# Patient Record
Sex: Male | Born: 1975 | Race: White | Hispanic: No | Marital: Married | Smoking: Never smoker
Health system: Southern US, Community
[De-identification: ages and names within clinical notes are randomized; demographics above are authoritative.]

## PROBLEM LIST (undated history)

## (undated) DIAGNOSIS — G43909 Migraine, unspecified, not intractable, without status migrainosus: Secondary | ICD-10-CM

## (undated) DIAGNOSIS — Z9889 Other specified postprocedural states: Secondary | ICD-10-CM

## (undated) DIAGNOSIS — J45909 Unspecified asthma, uncomplicated: Secondary | ICD-10-CM

## (undated) DIAGNOSIS — T7840XA Allergy, unspecified, initial encounter: Secondary | ICD-10-CM

## (undated) DIAGNOSIS — K219 Gastro-esophageal reflux disease without esophagitis: Secondary | ICD-10-CM

## (undated) DIAGNOSIS — M6282 Rhabdomyolysis: Secondary | ICD-10-CM

## (undated) DIAGNOSIS — L509 Urticaria, unspecified: Secondary | ICD-10-CM

## (undated) HISTORY — PX: SHOULDER SURGERY: SHX246

## (undated) HISTORY — DX: Migraine, unspecified, not intractable, without status migrainosus: G43.909

## (undated) HISTORY — DX: Allergy, unspecified, initial encounter: T78.40XA

## (undated) HISTORY — DX: Urticaria, unspecified: L50.9

## (undated) HISTORY — DX: Unspecified asthma, uncomplicated: J45.909

## (undated) HISTORY — DX: Gastro-esophageal reflux disease without esophagitis: K21.9

## (undated) HISTORY — DX: Rhabdomyolysis: M62.82

---
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
## 2003-01-27 ENCOUNTER — Encounter (FREE_STANDING_LABORATORY_FACILITY): Payer: Self-pay | Admitting: Pathology

## 2004-07-14 ENCOUNTER — Encounter (FREE_STANDING_LABORATORY_FACILITY): Payer: Self-pay | Admitting: Pathology

## 2005-07-04 ENCOUNTER — Ambulatory Visit (INDEPENDENT_AMBULATORY_CARE_PROVIDER_SITE_OTHER): Payer: Self-pay | Admitting: Cardiovascular Disease

## 2005-07-12 ENCOUNTER — Ambulatory Visit (INDEPENDENT_AMBULATORY_CARE_PROVIDER_SITE_OTHER): Payer: Self-pay | Admitting: Cardiovascular Disease

## 2007-03-29 ENCOUNTER — Ambulatory Visit (INDEPENDENT_AMBULATORY_CARE_PROVIDER_SITE_OTHER): Admitting: Specialist

## 2007-03-29 ENCOUNTER — Other Ambulatory Visit: Payer: Self-pay | Admitting: EXTERNAL

## 2007-04-22 ENCOUNTER — Ambulatory Visit (INDEPENDENT_AMBULATORY_CARE_PROVIDER_SITE_OTHER): Payer: BC Managed Care – PPO

## 2007-04-24 ENCOUNTER — Ambulatory Visit (INDEPENDENT_AMBULATORY_CARE_PROVIDER_SITE_OTHER): Payer: BC Managed Care – PPO | Admitting: ORTHOPEDIC, SPORTS MEDICINE

## 2007-05-08 ENCOUNTER — Ambulatory Visit (INDEPENDENT_AMBULATORY_CARE_PROVIDER_SITE_OTHER): Payer: BC Managed Care – PPO | Admitting: ORTHOPEDIC, SPORTS MEDICINE

## 2007-05-14 NOTE — Progress Notes (Signed)
[facility] Department of Orthopaedics  [address]  [place], [place] [address]      SPORTS MEDICINE PROGRESS NOTE    PATIENT NAME: Mike Allen, Mike Allen  CHART NUMBER: [id number]  DATE OF BIRTH: 1975-07-14  DATE OF SERVICE: 05/08/2007    SUBJECTIVE: Keilan presents for followup of his gastrocnemius and Achilles partial tear. He reports improvement of his symptoms since I have previously seen him. He also notes a development of a bruise really across three separate areas; one over the lateral calf in the area in which he initially felt a tearing sensation and also one over the posterior calf area and the Achilles and another over the medial aspect of the heel. These have begun to improve. His brace has been in a position of 10 degrees of plantar flexion as he is unable to get his foot into neutral position. He states he is now able to get his foot in to neutral position and would like to adjust the brace accordingly. He is really not using the Percocet at this point. He is still using Advil occasionally.    OBJECTIVE: There is mild tenderness over the lateral gastrocnemius. There is really no tenderness over the medial gastrocnemius. There is mild diffuse tenderness over the Achilles tendon. He has significant weakness with any active plantar flexion. Thompson's test is negative. He has discomfort with passive dorsiflexion. He is able to get the foot to just beyond neutral. Pulses are palpable, and capillary refill is brisk.    ASSESSMENT: Left lateral gastrocnemius strain and Achilles partial thickness tear.    PLAN: He will continue in the boot. We have adjusted it to a neutral position. He will continue with the Advil as needed, and I will see him back in two weeks.      Beola Cord, MD  Assistant Professor  [place] Department of Orthopaedics    [id number]; D: 05/08/2007 17:09:19; T: 05/10/2007 14:00:01

## 2007-05-22 ENCOUNTER — Encounter (INDEPENDENT_AMBULATORY_CARE_PROVIDER_SITE_OTHER): Payer: BC Managed Care – PPO | Admitting: ORTHOPEDIC, SPORTS MEDICINE

## 2007-05-29 NOTE — Progress Notes (Signed)
 [facility] Department of Orthopaedics  [address]  [place], [place] [address]      SPORTS MEDICINE PROGRESS NOTE    PATIENT NAME: Mike Allen, Mike Allen  CHART NUMBER: [id number]  DATE OF BIRTH: 12-17-1975  DATE OF SERVICE: 05/22/2007    SUBJECTIVE: Mike Allen presents for followup of his right Achilles partial tear. He has been in the Cam boot since his last appointment. He reports coming out only with brief episodes at home. He is able to improve his motion across his ankle. He also notes a decreasing amount of swelling throughout his calf.    OBJECTIVE: On examination, the medial and lateral gastrocnemius are nontender. There is no swelling within the gastrocnemius present at this time. There continues to be some fullness in induration across the Achilles tendon. He is moderately tender across the proximal aspect of the Achilles. He is minimally tender over the distal Achilles tendon. He continues to be significantly tender, approximately 2 to 4 cm above the distal insertion. He maintains active motion with dorsiflexion, plantarflexion, inversion and eversion. He has improved strength with resisted plantar flexion upon first standing. He has minimal discomfort. He is able to do a heel lift with both feet; however, he puts most of his pressure on the opposite side.    ASSESSMENT: Partial thickness Achilles tear and lateral gastrocnemius strain.    PLAN: He will continue with the Cam walker with any prolonged walking. However, he can begin coming out of the Cam walker for short durations. Additionally, I have requested that he begin physical therapy working with modalities, stretching, and progressive strengthening. I will see him back in two weeks.      Morene JONETTA Houseman, MD  Assistant Professor  [place] Department of Orthopaedics    [id number]; D: 05/22/2007 18:13:02; T: 05/24/2007 06:39:34

## 2007-06-05 ENCOUNTER — Encounter (INDEPENDENT_AMBULATORY_CARE_PROVIDER_SITE_OTHER): Payer: BC Managed Care – PPO | Admitting: ORTHOPEDIC, SPORTS MEDICINE

## 2007-06-11 NOTE — Progress Notes (Signed)
[facility] Department of Orthopaedics  [address]  [place], [place] [address]      SPORTS MEDICINE PROGRESS NOTE    PATIENT NAME: Mike Allen, Mike Allen  CHART NUMBER: [id number]  DATE OF BIRTH: July 01, 1975  DATE OF SERVICE: 06/05/2007    SUBJECTIVE: Mike Allen presents for followup of his partial Achilles tear. He is doing significantly better. He is able to fully wean from the Cam walker. He has now been out of the Cam walker for about five days. He notes that he is limping more so in the morning as he feels that he has significant tightness in his calf when he first awakens.    OBJECTIVE: He is moderately tender to palpation over the distal Achilles tendon approximately 4 cm above the distal insertion. He is mildly tender to palpation across the lateral gastrocnemius and to a lesser degree over the medial gastrocnemius. He maintains good motion with dorsiflexion and plantar flexion. Strength is significantly improved with resisted plantar flexion, and he has minimal discomfort with this. He remains grossly neurovascularly intact across the lower extremity. Pulses are palpable.    ASSESSMENT: Partial thickness Achilles tear and lateral gastroc strain.    PLAN: He will continue with physical therapy. At this point, it is appropriate to begin a strengthening program. Additionally, he may benefit from using the Cam walker while sleeping at night to prevent further constriction and tightening of the Achilles overnight. I will see him back in three weeks to see how he has done with continued therapy and progressive strengthening.      Beola Cord, MD  Assistant Professor  [facility] Department of Orthopaedics    [id number]; D: 06/05/2007 16:54:24; T: 06/06/2007 23:37:09

## 2007-06-26 ENCOUNTER — Encounter (INDEPENDENT_AMBULATORY_CARE_PROVIDER_SITE_OTHER): Payer: BC Managed Care – PPO | Admitting: ORTHOPEDIC, SPORTS MEDICINE

## 2007-06-28 ENCOUNTER — Encounter (INDEPENDENT_AMBULATORY_CARE_PROVIDER_SITE_OTHER): Payer: BC Managed Care – PPO | Admitting: Specialist

## 2007-07-06 NOTE — Progress Notes (Signed)
[facility] Department of Orthopaedics  [address]  [place], [place] [address]      PROGRESS NOTE    PATIENT NAME: Mike Allen, Mike Allen  CHART NUMBER: [id number]  DATE OF BIRTH: August 15, 1975  DATE OF SERVICE: 06/26/2007    SUBJECTIVE: Mr. Ramakrishnan presents today for followup of his right gastrocnemius strain and partial tear of the Achilles tendon. He brings a note today from physical therapy. They report that he is making good progress, but does still have some significant strength deficit. They also question whether he would benefit from orthotics. He actually brings in 2 different pairs of orthotic inserts that he has. One pair is a custom made orthotic from a few years ago. This is a soft orthotic. It does have a medial heel post, but has a very flexible arch support. His second pair he purchased at a specialty store in [place]. This has a more firm plastic arch support; however, he does note that there is an area of the plastic, which is beginning to break down already and he has only had these for around 4 or 5 months.     OBJECTIVE: He has a mild limp. He is nontender over the medial gastroc. He is moderately tender over the medial border of the Achilles tendon primarily about 2-4 cm above the distal insertion site. He is nontender over the calcaneus. He is nontender over the ankle. He has adequate strength on manual muscle testing with dorsiflexion and plantarflexion, inversion and eversion, and has minimal discomfort with this. He does have pain isolated to the Achilles with passive stretching, but he remains neurovascularly intact.     ASSESSMENT: Right Achilles partial thickness tear and medial gastrocnemius strain.     PLAN: He will continue with progressive strengthening in physical therapy. Additionally, I have given him an order for custom orthotics, as I feel that a new pair of custom orthotics could combine the benefits of improved arch support, as well as the medial heel post. This should also be more durable than his current off the shelf orthotics that he is using. I will see him back in 4-5 weeks.      Beola Cord, MD  Assistant Professor  [place] Department of Orthopaedics    [id number]; D: 06/26/2007 12:47:58; T: 06/29/2007 16:32:03

## 2007-08-07 ENCOUNTER — Encounter (INDEPENDENT_AMBULATORY_CARE_PROVIDER_SITE_OTHER): Payer: BC Managed Care – PPO | Admitting: ORTHOPEDIC, SPORTS MEDICINE

## 2007-08-08 NOTE — Progress Notes (Signed)
 [facility] Department of Orthopaedics  [address]  [place], [place] [address]      SPORTS MEDICINE PROGRESS NOTE    PATIENT NAME: Mike, Allen  CHART NUMBER: [id number]  DATE OF BIRTH: 11/03/1975  DATE OF SERVICE: 08/07/2007    SUBJECTIVE: Mike Allen presents for followup of his right Achilles partial tear. He is doing very well. He states he was to bring a note from his therapist, but left this in his car but apparently per the therapist, they are recommending a transition to into a home program. He has been using the custom orthotics and feels very good on the right side and notes some discomfort in his arch on the left side. Incidentally, he notes that it just before injuring his right Achilles he was beginning to have some arch pain on the left side; however, this really has not been bothering him as he has been recovering from his injury. He is now up to a very early running program, running up to a half a mile at a 5-1/2 minute pace. He is eager to continue to progress his running activities as well as to return to basketball and some soccer activities.    OBJECTIVE: He is in no acute distress. He has a nonantalgic gait. Right Achilles: There is a fullness of the distal Achilles about 2-3 cm proximal to the distal insertion. He is minimally tender in this area. He has good flexibility of the calf muscles and has no pain with passive stretching. He has good strength on dorsiflexion, inversion and eversion. There is weakness on heel raise on the right side compared to the left and has minimal difficulty with balance on heel raise on the isolated right side. Left foot: There is minimal tenderness over the mid to distal plantar arch, which is noted more so with the foot in a passive dorsiflexed position. He really has no tenderness or discomfort with full plantarflexion. He is nontender over the Achilles tendon. He is grossly neurovascularly intact across both lower extremities.    ASSESSMENT:  1.  Right Achilles tendon strain.  2. Left plantar fasciitis.    PLAN: He will transition from physical therapy to a home exercise program. He will continue with pliometrics as well as a functional progression with return to activity. We will still avoid full sprinting until he is back to running at a steady pace for a greater period of time. I will see him back in one month to see how he has done with the functional progression. Additionally, recommend aggressive stretching and icing on the left side. If he is having continued problems on the left side with pain we can certainly modify his orthotics, although his orthotics appear to be holding him in a proper position at the present.      Morene JONETTA Houseman, MD  Assistant Professor  [facility] Department of Orthopaedics    [id number]; D: 08/07/2007 09:46:01; T: 08/08/2007 11:28:01

## 2007-09-04 ENCOUNTER — Encounter (INDEPENDENT_AMBULATORY_CARE_PROVIDER_SITE_OTHER): Payer: BC Managed Care – PPO | Admitting: ORTHOPEDIC, SPORTS MEDICINE

## 2009-10-26 ENCOUNTER — Ambulatory Visit (HOSPITAL_BASED_OUTPATIENT_CLINIC_OR_DEPARTMENT_OTHER): Payer: BC Managed Care – PPO

## 2009-10-26 ENCOUNTER — Ambulatory Visit (INDEPENDENT_AMBULATORY_CARE_PROVIDER_SITE_OTHER): Payer: BC Managed Care – PPO

## 2009-10-26 ENCOUNTER — Encounter (INDEPENDENT_AMBULATORY_CARE_PROVIDER_SITE_OTHER): Payer: Self-pay

## 2009-10-26 VITALS — BP 121/80 | HR 78 | Temp 98.2°F | Resp 18 | Wt 144.2 lb

## 2009-10-26 DIAGNOSIS — R109 Unspecified abdominal pain: Secondary | ICD-10-CM

## 2009-10-26 DIAGNOSIS — K5900 Constipation, unspecified: Secondary | ICD-10-CM

## 2009-10-26 NOTE — Progress Notes (Signed)
History of Present Illness: Mike Allen is a 34 y.o. male who presents to the Urgent Care today with chief complaint of Abdominal Cramping for 1 week(s), fluctuates.  Bloating at times, gas at times, cramping.   Occasional diarrhea described as soft stools.  Also, patient has associated occasional acid reflux and nausea. States ate at [employer] last Tuesday and states wings did not taste right.  Has not tried antiacids.  No history of acid reflux, GERD.  No vomiting, fevers.  Decreased appetite, forcing self to eat and drink.  No recent ABX.  Was in [place] end of [place] for work but no issues there.  Symptoms began after eating out last Tuesday locally.  No dysuria. No back pain.    Location: GI  Quality: cramping and bloating  Onset: 6 days ago  Severity: 4 out of 10  Timing: intermittent  Context: none  Modifying factors: fluids, yogurt  Associated symptoms: as above    I reviewed and confirmed the patient's past medical history taken by the nurse or medical assistant with the addition of the following:    Past Medical History:    Past Medical History   Diagnosis Date   . Migraine        Past Surgical History:    History reviewed.  No pertinent past surgical history.  Allergies:  No Known Allergies  Medications:    Current outpatient prescriptions   Medication Sig   . Ibuprofen (MOTRIN) 200 mg Tab take 200 mg by mouth Four times a day as needed.   Marland Kitchen MAGNESIUM ORAL take  by mouth.   Marland Kitchen ZINC ORAL take  by mouth.   Marland Kitchen amitriptyline (ELAVIL) 10 mg Tab 1qhs x 7d, then 2qhs x 7d, then 3qhs       Social History:    History   Substance Use Topics   . Smoking status: Never Smoker    . Smokeless tobacco: Not on file   . Alcohol Use: Yes      social       Family History: No significant family history.  No family history on file.  Review of Systems:  General: no fever and fatigue  Gastrointestinal: nausea, no vomiting, diarrhea, abdominal pain and bloating and cramping  Genitourinary: no dysuria and no flank pain   All other review of systems were negative    Physical Exam:  Vital signs:   Filed Vitals:    10/26/09 1337   BP: 121/80   Pulse: 78   Temp: 36.8 C (98.2 F)   TempSrc: Tympanic   Resp: 18   Weight: 65.4 kg (144 lb 2.9 oz)   SpO2: 98%       General: Well appearing and No acute distress  Pulmonary: clear to auscultation bilaterally, no wheezes, no rales and no rhonchi  Cardiovascular: regular rate/rhythm and normal S1/S2  Gastrointestinal: Non-distended, normal bowel sounds, no rebound, no guarding and TTP left lower quad and mid-epigastric region  Psychiatric: appropriate affect and behavior  Neurologic: alert and oriented x 3    Data Reviewed  Radiography:  KUB- moderate stool retention  Urine Dip Results:   Time collected: 1430  Glucose: Negative  Bilirubin: Negative  Ketones: Negative  Specific Gravity: 1.020  Blood: Negative  pH: 6.0  Protein: Negative  Urobilinogen: Normal   Nitrite: Negative  Leukocytes: Negative           Course: Condition at discharge: Stable    Differential Diagnosis: diverticulosis, pancreatitis, cholecystitis, appendicitis  Assessment: 1. Constipation (564.00A)    2. Abdominal bloating (787.3X)    3. Abdominal cramping (789.00BM)        Plan:  Orders Placed This Encounter   . XR KUB   . Urine RACK without Microscopy        Recommend OTC miralax.   Plan was discussed and patient verbalized understanding.  If symptoms are worsening or not improving the patient should return to the Urgent Care for further evaluation. Present to ED with any concerning or emergent symptoms for further workup and emergent care.    Supervising physician was physically present in Urgent Care and available for consultation and did not participate in the care of this patient.   Barrett Henle, PA-C 10/26/2009, 2:43 PM

## 2009-10-26 NOTE — Patient Instructions (Addendum)
Orders Placed This Encounter   . XR KUB   . Urine RACK without Microscopy    Recommend OTC Miralax as needed.  ________________________________________________________________________  Short Term Disability and Family Medical Leave Act  [facility] does NOT provide assistance with any disability applications.  If you feel your medical condition requires you to be on disability, you will need to follow up with  Your primary care physician or a specialist.  We apologize for any inconvenience.    For Medication Prescribed by [facility]:  As an Urgent Care facility, our clinic does NOT offer prescription refills over the telephone.    If you need more of the medication one of our medical providers prescribed, you will  Either need to be re-evaluated by [place] or see your primary care physician.    ________________________________________________________________________      It is very important that we have a phone number that is the single best way to contact you in the event that we become aware of important clinical information or concerns after your discharge.  If the phone number you provided at registration is NOT this number you should inform staff and registration prior to leaving.      Your treatment and evaluation today was focused on identifying and treating potentially emergent conditions based on your presenting signs, symptoms, and history.  The resulting initial clinical impression and treatment plan is not intended to be definitive or a substitute for a full physical examination and evaluation by your primary care provider.  If your symptoms persist, worsen, or you develop any new or concerning symptoms, you need to be evaluated.       If you received x-rays during your visit, be aware that the final and formal interpretation of those films by a radiologist may occur after your discharge.  If there is a significant discrepancy identified after your discharge, we will contact you at th telephone number provided at registration.      If you received a pelvic exam, you may have cultures pending for sexually transmitted diseases.  Positive cultures are reported to the [place] [facility] Department of Health as required by state law.  You should be contacted if you cultures are positive.  We will not contact you if they are negative.  You may contact the Health Information Management Office of [facility] to get a copy of your results.  You did NOT receive a PAP smear (the screening test for cervical).  This specific test for women is best performed by your gynecologist or primary care provider when indicated.      If you are over 44 year old, we cannot discuss your personal health information with a parent, spouse, family member, or anyone else without your express consent.  This does not include those who have legitimate access to your records and information to assist in your care under the provisions of HIPAA [facility] Portability and Accountability Act) law, or those to whom you have previously given express written consent to do so, such a legal guardian or Power of [place].      You may have received medication that may cause you to feel drowsy and/or light headed for several hours.  You may even experience some amnesia of your stay.  You should avoid operating a motor vehicle or performing any activity requiring complete alertness or coordination until you feel fully awake (approximately 24-48 hours).  Avoid alcoholic beverages.  You may also have a dry mouth for several  hours.  This is a normal side effect and will disappear as the effects of the medication wear off.       Instructions discussed with patient upon discharge by clinical staff with all questions answered.  Please call [facility] [phone]) if any further questions.  Go immediately to the emergency department if any concern or worsening symptoms.        Barrett Henle, PA-C 10/26/2009, 2:40 PM    Constipation in Adults     You are having problems with constipation. Constipation is having fewer than two bowel movements per week. Usually the stools are hard. As we grow older, constipation is more common. If you try to fix this with laxatives, the problem may often get worse. Laxatives taken over a long period of time make the colon muscles weaker. This will gradually make the constipation worse. A diet low in fiber, not taking in enough fluids, and taking some medications may all make these problems worse.     SOME MEDICATIONS WHICH MAY CAUSE CONSTIPATION ARE:   Diuretics (water pills)   Anticholinergics      Calcium channel blockers (a medication used for controlling blood pressure and used for the heart)   Anti-inflammatory agents (ibuprofen such as Advil or Motrin)     Narcotics (certain pain medications)    Antacids which contain aluminum       SOME DISEASES WHICH CONTRIBUTE TO CONSTIPATION ARE:    Diabetes    Strokes     Parkinson's disease   Depression     Dementia (the mind is not working properly and the thinking which makes [place] who we are is gone)   Other illnesses causing difficulties with salt and water metabolism       HOME CARE INSTRUCTIONS   Constipation is usually best cared for without medications. It can be helped by increasing dietary fiber and eating more fruits and vegetables. Metamucil may be added to your diet; take a tablespoon in an eight ounce glass of water or juice four times per day. Increasing your activities also helps improve regularity.    Ducolax or other suppositories as suggested by your caregiver will also help stimulate the colon to empty. If you are using antacids, such as aluminum or calcium containing products, which cause constipation, it will be helpful to switch to products containing magnesium if your caregiver has no objections.     If you have been given an enema today, this is only a temporary measure and should not be relied on for treatment of chronic (longstanding) constipation. If enemas are used long term, they will weaken the muscles in your colon and make constipation worse.     Stronger measures such as magnesium sulfate should be avoided if possible as these may lead to uncontrollable diarrhea. These stronger measures are tempting to use, however, if you are elderly. Using something like this will often not allow you time to even make it to the bathroom.     SEEK IMMEDIATE MEDICAL ATTENTION IF:   There is bright red blood in the stool.   The constipation continues for more than four days.   There is abdominal or rectal pain along with the constipation.   You have any questions or concerns or do not seem to be getting better.     Document Released: 04/04/2005  Document Re-Released: 09/26/2005  [facility] Patient Information 2008 [place], [place].

## 2012-12-12 ENCOUNTER — Emergency Department (EMERGENCY_DEPARTMENT_HOSPITAL): Payer: BC Managed Care – PPO

## 2012-12-12 ENCOUNTER — Emergency Department (HOSPITAL_COMMUNITY): Payer: BC Managed Care – PPO

## 2012-12-12 ENCOUNTER — Emergency Department
Admission: EM | Admit: 2012-12-12 | Discharge: 2012-12-12 | Disposition: A | Discharge status: Home / Self Care | Payer: BC Managed Care – PPO | Attending: Emergency Medicine | Admitting: Emergency Medicine

## 2012-12-12 MED ORDER — FENTANYL (PF) 50 MCG/ML INJECTION SOLUTION
INTRAMUSCULAR | Status: DC
Start: 2012-12-12 — End: 2012-12-13
  Filled 2012-12-12: qty 2

## 2012-12-12 MED ORDER — FENTANYL (PF) 50 MCG/ML INJECTION SOLUTION
100.00 ug | INTRAMUSCULAR | Status: AC
Start: 2012-12-12 — End: 2012-12-12
  Administered 2012-12-12: 100 ug via INTRAVENOUS

## 2012-12-12 MED ORDER — HYDROCODONE 5 MG-ACETAMINOPHEN 325 MG TABLET
1.00 | ORAL_TABLET | ORAL | Status: DC | PRN
Start: 2012-12-12 — End: 2014-08-12

## 2012-12-12 MED ORDER — PROPOFOL 10 MG/ML IV BOLUS
1.00 mg/kg | INJECTION | INTRAVENOUS | Status: AC
Start: 2012-12-12 — End: 2012-12-12
  Administered 2012-12-12: 100 mg via INTRAVENOUS
  Filled 2012-12-12: qty 20

## 2012-12-12 MED ORDER — FENTANYL (PF) 50 MCG/ML INJECTION SOLUTION
50.00 ug | INTRAMUSCULAR | Status: AC
Start: 2012-12-12 — End: 2012-12-12
  Administered 2012-12-12: 50 ug via INTRAVENOUS

## 2012-12-12 MED ORDER — FENTANYL (PF) 50 MCG/ML INJECTION SOLUTION
100.00 ug | INTRAMUSCULAR | Status: AC
Start: 2012-12-12 — End: 2012-12-12
  Administered 2012-12-12: 100 ug via INTRAVENOUS
  Filled 2012-12-12 (×2): qty 2

## 2012-12-12 MED ADMIN — fentaNYL (PF) 50 mcg/mL injection solution: 50 ug | INTRAVENOUS | NDC 00409909422

## 2012-12-12 NOTE — ED Resident Handoff Note (Signed)
No Known Allergies    Filed Vitals:    12/12/12 1843 12/12/12 2042   BP: 158/94 135/68   Pulse: 88 85   Temp: 36.7 C (98.1 F) 36.5 C (97.7 F)   Resp: 20 18   SpO2: 99% 97%       HPI:  Mike Allen is a 37 y.o. male presents with R shoudler dislocation.No trauma.    Pertinent Exam Findings:  Ant dislocation RUE  Intact nv    Pertinent Imaging/Lab results:  Labs Reviewed - No data to display         Pending Studies:  Xray post reduction    Consults:  none    Plan:  Fu post reduction xray  Dc with pain medicine    After a thorough discussion of the patient including presentation, ED course, and review of above information I have assumed care of Mike Allen from Dr. Kyung Rudd at 9:09 PM      Selinda Orion, MD 12/12/2012, 9:09 PM    Course:  Post reduction XR with hold hill sachs deformity and successful reduction. Pt recovered from sedation wo complication and was dc to hoem with sling and script for Norco for pain. He will fu with sports medicine in 2-3d and return to er for worsneing symptoms. He was given instruction on precautions of shoulder dislocation.    New Prescriptions    HYDROCODONE-ACETAMINOPHEN (NORCO) 5-325 MG ORAL TABLET    Take 1-2 Tabs by mouth Every 4 hours as needed for Pain     Follow up with sports medicine in 2-3d.  Follow up with PCP in 2-3 days for symptoms or sooner if needed.  The patient was DC to home with instructions including the patient's diagnosis, treatment and follow up care. The instructions were read to the patient and the patient encourage to express any questions. Time was made to answer all questions the patient may have. All questions were answered to the patient's satisfaction.The patient's verbalized understanding of the DC instructions, medication, medication side effects, and any follow up care. The patient agrees with the plan of care. The pt was advised to return to the Emergency Department with any concerning or persistent symptoms or progression of symptoms  for emergent care and follow up.    Selinda Orion, MD 12/12/2012, 9:52 PM

## 2012-12-12 NOTE — ED Attending Note (Signed)
Note begun by:  Jesse Sans, MD 12/12/2012, 6:51 PM    I was physically present and directly supervised this patient's care.  Patient seen and examined.  Resident / Trixie Dredge / NP history and exam reviewed.   Key elements in addition to and/or correction of that documentation are as follows:    HPI :    37 y.o. male presents with chief complaint of shoulder dislocation. He says this happened several times in the past and he had surgery for it in 1998. Has not happened for a while. He had been swimming today and was sitting afterwards and resting his arm behind his head and rolled over onto his arm and felt his shoulder pop out. No specific trauma.     PE :   VS on presentation: Blood pressure 158/94, pulse 88, temperature 36.7 C (98.1 F), resp. rate 20, height 1.778 m (5\' 10" ), weight 68.04 kg (150 lb), SpO2 99.00%.  Radial pulse and deltoid sensation intact. He has obvious shoulder deformity on exam typical for an anterior  shoulder dislocation.   Data/Test :          Review of Prior Data :             Clinical Impression :   Shoulder dislocation.     MDM :       ED Course :        Plan :   We attempted reduction with IV Fentanyl. Still having too much pain so signed out with intra-articular block and re- attempt pending.     CRITICAL CARE : None

## 2012-12-12 NOTE — ED Attending Handoff Note (Signed)
Orders Placed This Encounter   . XR SHOULDER RIGHT W AXIAL CLAVICLE   . fentaNYL (SUBLIMAZE) 50 mcg/mL injection   . fentaNYL (PF) (SUBLIMAZE) injection ---Cabinet Override     Hx of frequent shoulder dislocations.     No specific trauma, but shoulder dislocated.     Patient sedated with propofol. Given 1.5mg /kg total  Shoulder goes back into place with some external rotation    XRs negative following reduction.     Patient discharged. Will follow up with orthopaedics.

## 2012-12-12 NOTE — ED Nurses Note (Signed)
 Patient fell off bed dislocation of the right shoulder +PMS distally obvious deformity.

## 2012-12-12 NOTE — ED Provider Notes (Signed)
[facility]  Emergency Department    Chief Complaint:  dislocation  SUBJECTIVE:  HPI: Mike Allen is a 37 y.o. male who presents with a right-side shoulder dislocation.  The patient has a history of chronic shoulder dislocation, s/p repair.  He went swimming today then later in the day raised his right arm above his head.  He felt immediate onset pain, deformity.  No numbness / tingling / weakness.  He denies all other symptoms.     Past Medical History   Diagnosis Date   . Migraine      Surgical history:  Shoulder repair.  No adverse events with anesthesia.     History   Substance Use Topics   . Smoking status: Never Smoker    . Alcohol Use: Yes      Comment: social     Medications Prior to Admission    Outpatient Medications    amitriptyline (ELAVIL) 10 mg Tab    1qhs x 7d, then 2qhs x 7d, then 3qhs    Ibuprofen (MOTRIN) 200 mg Tab    take 200 mg by mouth Four times a day as needed.    MAGNESIUM ORAL    take  by mouth.    ZINC ORAL    take  by mouth.        No Known Allergies    Family Hx:  No anesthesia adverse events.     ROS:   Constitutional: negative for fevers and chills  Ears, nose, mouth, throat, and face: negative for rhinorrhea and sore throat  Respiratory: negative for cough or wheezing  Cardiovascular: negative for chest pain and dyspnea  Gastrointestinal: negative for nausea, vomiting  Genitourinary:negative for frequency and dysuria  Integument/breast: negative for rash and skin lesion  Hematologic/lymphatic: negative for bruising and bleeding  Musculoskeletal: + arm deformity, + arm pain  Neurological: negative for headaches and dizziness    Exam:  BP 158/94   Pulse 88   Temp(Src) 36.7 C (98.1 F)   Resp 20   Ht 1.778 m (5\' 10" )   Wt 68.04 kg (150 lb)   BMI 21.52 kg/m2   SpO2 99%  General: appears in good health, uncomfortable  Eyes: Conjunctiva clear. Pupils equal and round, reactive to light   HENT: Head atraumatic. normocephalic, mucous membranes moist.   Neck: supple, symmetrical, trachea midline  Lungs: Equal chest excursion, no wheeze  Cardiovascular: Regular rate and rhythm   Extremities: right shoulder anterior dislocation, patient holds the arm internally rotated, no cyanosis or edema, intact radial pulse, sensation intact throughout ulnar / median / radial distribution, sensation intact over the deltoid muscle, motor intact throughout the hand  Skin: Skin color, texture, turgor normal. No rashes or lesions  Neurologic: CN II - XII grossly intact, Alert and oriented x3, No tremor  Psychiatric: Normal affect, behavior, speech    Imaging:   Results for orders placed during the hospital encounter of 12/12/12 (from the past 72 hour(s))   XR SHOULDER RIGHT W AXIAL CLAVICLE     Status: Abnormal    Narrative:     XR SHOULDER RIGHT W AXIAL CLAVICLE performed on Raul Del on Dec 12, 2012  9:08 PM.     CLINICAL HISTORY: 37 y.o. male with right shoulder dislocation.    TECHNIQUE: XR SHOULDER RIGHT W AXIAL CLAVICLE    COMPARISON: None.    FINDINGS: Bone mineralization is normal. There is mild anterior   translation of the humeral head.  Irregularity of  the humeral head at the   superior lateral aspect likely reflects old Hill-Sachs deformity. There is   deformity of the distal clavicle which may reflect chronic post traumatic   or post surgical change. No acute osseous abnormalities are demonstrated.    The humeral head and glenoid are congruent. Post traumatic bony remodeling   of the glenoid is seen consistent with a healed bony Bankart lesion. The   visualized lung fields are clear.      Impression:          No acute osseous abnormality.  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           MDM    Impression/Plan:   1. Shoulder dislocation:  - Shoulder reduction attempted with pain control, Lidocaine shoulder joint injection  - Shoulder reduction successful under procedural sedation with Propofol  - Post-reduction films are negative for acute fracture   - Patient placed in a shoulder sling    I discussed the patient with Dr. Deatra Robinson who assumed care.     Orders Placed This Encounter   . XR SHOULDER RIGHT W AXIAL CLAVICLE   . fentaNYL (SUBLIMAZE) 50 mcg/mL injection   . fentaNYL (PF) (SUBLIMAZE) injection ---Cabinet Override   . fentaNYL (SUBLIMAZE) 50 mcg/mL injection   . fentaNYL (SUBLIMAZE) 50 mcg/mL injection       Edwina Barth, MD 12/12/2012, 7:59 PM

## 2012-12-12 NOTE — Discharge Instructions (Signed)
Please use ice, heat, over the counter pain medications such as tylenol or ibuprofen if instructed not to avoid these, for your symptoms.    Please take all medications you were prescriptions for as prescribed for your symptoms or diagnoses.    Please follow up with any specialist provider in clinic that your were given instructions to see as an outpatient.    Please follow up with your primary care physician in 3-5 days or earlier if needed for your symptoms.    Please return to the Emergency department if you have persistence or worsening of your symptoms.      General Instructions:   You are considered stable for discharge from the emergency department.  Please carefully follow all the instructions you were given verbally as well as the written instructions given below.  In general, immediately return to the emergency department if the symptoms you presented with today increase in severity, change in any way, and/or do not improve in what you consider an acceptable time frame.  Return if you develop fever >101, vomiting, oral liquid intolerance, chest pain, shortness of breath, weakness, change in behavior, or any other concerns.    Medication(s) Instructions (if applicable):   If you were given any medication(s) upon discharge, please strictly follow the directions as prescribed for taking the medication(s).  Should you feel you develop any type of reaction to the medication(s), including, but not limited to, rash, swelling of the lips or face, or difficulty breathing, immediately discontinue the use of the medication(s) and seek prompt medical care.  Please read the medication(s) insert provided by the pharmacy and follow all guidelines and recommendations.       Follow-Up Instructions:   If you were given instructions to follow-up with a health care provider upon discharge, please be sure to do so.  It is your responsibility to call and/or make an appointment with the health care providers listed on your  discharge papers and/or your primary care provider in the appropriate time frame given.  Please take a copy of your discharge papers with you to your follow-up appointment(s).       YOU MUST CALL THE NUMBER LISTED ON THE DISCHARGE PAPERWORK TO CONFIRM YOUR APPOINTMENT.      Special Information / Instructions:     Please read and follow all attached discharge instructions.      Please call the [place] [facility] at [phone] with any questions or concerns.

## 2012-12-12 NOTE — ED Nurses Note (Signed)
Patient given d/c instructions.  Patient shows verbal understanding.  Patient iv d/c.  Patient d/c to home by [facility].

## 2012-12-12 NOTE — Procedures (Addendum)
Encounter Date: 12/12/2012    Emergency Department Procedure:    Reduction of Dislocation / Fracture:  Procedure performed at: 2100  Type of Reduction:: Dislocation  Location: Shoulder  Local Anesthesia: Lidocaine Plain;2%;Joint Injection  Reduced using: Manipulation  Reduction was: Felt;Heard;Observed  Patient has: sensation;circulation;intact function  Post Reduction X-Ray: Reduction  Attending Physician: Gretchen Short    Conscious sedation with Propofol given as 35 mg, 35 mg, 30 mg.  The patient tolerated the procedure well without complications.     Edwina Barth, MD 12/12/2012, 9:25 PM      I was physically present in the ED and supervised the entire procedure(s) as documented above.    Gretchen Short, MD 12/13/2012, 1:48 AM

## 2012-12-12 NOTE — ED Nurses Note (Signed)
 MD in room trying to relocate shoulder

## 2012-12-12 NOTE — ED Nurses Note (Signed)
MD in room sedating patient to relocate shoulder

## 2012-12-14 ENCOUNTER — Ambulatory Visit: Payer: BC Managed Care – PPO | Attending: Sports Medicine | Admitting: Sports Medicine

## 2012-12-14 ENCOUNTER — Encounter (INDEPENDENT_AMBULATORY_CARE_PROVIDER_SITE_OTHER): Payer: Self-pay | Admitting: Sports Medicine

## 2012-12-14 VITALS — BP 135/79 | HR 58 | Temp 97.3°F | Ht 70.0 in | Wt 147.3 lb

## 2012-12-14 DIAGNOSIS — M24419 Recurrent dislocation, unspecified shoulder: Secondary | ICD-10-CM | POA: Insufficient documentation

## 2012-12-15 NOTE — H&P (Addendum)
[facility]                              DEPARTMENT OF [place], [place] [address]                                PATIENT NAME: Mike Allen, Mike Allen [facility] UEAVWU:[id number]  DATE OF SERVICE:12/14/2012  DATE OF BIRTH: 1975-07-03    HISTORY AND PHYSICAL    HISTORY OF PRESENT ILLNESS:  This is a 37 year old male who presents to Dr. Alton Revere clinic today for evaluation of a prior right shoulder dislocation.  He was seen in the Emergency Department on December 12, 2012, after he suffered a right shoulder dislocation.  He says that the dislocations happen while he was sleeping one night.  He had his arm extended above his head.  He decided to roll out of the bed at which time the shoulder dislocated.  He went to the Emergency Department where it was reduced.  He was then told to follow up with Dr. Lewis Moccasin.  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  His first dislocation was traumatic in nature.  He does state, however, that he has not had a shoulder dislocation in over 15 years.  He says during his teenage years he also had some sort of surgical intervention.  He describes it as a shoulder arthroscopy with a soft tissue procedure.  Although we do not know the exact procedure that was done, it does sound like he possibly had a labral repair at that time.  Currently, he does report pain in the involved extremity, especially with movement.  He has been wearing the sling since he was discharged.  He denies any numbness or tingling of the involved extremity.    REVIEW OF SYSTEMS:  Right upper extremity per HPI.  All other review of systems are negative.    PAST MEDICAL HISTORY:  Migraines.    PAST SURGICAL HISTORY:  Right shoulder surgery as described in HPI.    MEDICATION HISTORY:  He is currently on ibuprofen as needed.    ALLERGIES:  No known drug allergies.     FAMILY HISTORY:  Denies any family history of anesthesia problems.    SOCIAL HISTORY:  He works as an Water quality scientist.  He is very active.  He does not smoke.  He does not use drugs.  He occasionally drinks alcohol.    PHYSICAL EXAMINATION:  Blood pressure 135/79, pulse 58, temperature 97.3 degrees Fahrenheit, weight 66.8 kg.  Normal-appearing male in no acute distress, nonlabored breathing.  HEENT exam is normocephalic and atraumatic.  Focused right upper extremity exam, sensation intact to light touch in distribution of radial, ulnar and median nerves.  Palpable radial pulse.  No open wounds or lacerations.  He is able to flex to about 90 degrees, abduct to 90 degrees and internally rotate to the level of the T-spine, externally rotate about 30 degrees.  All these movements, especially forward flexion and external rotation, cause apprehension and feelings of his shoulder dislocating.    IMAGING:  X-rays of the right shoulder from the Emergency Department were reviewed on PACS with Dr. Lewis Moccasin.  They did not show any type of acute fracture or dislocation.    ASSESSMENT:  This is a 37 year old male with a history of chronic right shoulder dislocations with the recent shoulder dislocation on December 12, 2012.    SUBJECTIVE:  At this time, we believe that the patient would benefit from an MRI arthrogram of his right shoulder.  This will help [place] to evaluate the contents within the shoulder to see if there needs to be any type of operative intervention.  Until that time, we want him to continue wearing the sling for comfort, apply ice to his shoulder to help with some of the swelling.  After he gets the MRI completed, we are going to go ahead follow him back up in clinic to discuss the results.  The patient was agreeable with this plan.  All questions were answered.  If he has any more questions or concerns, we told him to go ahead and call us in clinic.      Linden Dolin, MD  Resident   [place] Department of Orthopaedics    E. Letitia Caul, MD  Associate Professor  [facility] Department of Orthopaedics    [id number]; D: 12/14/2012 16:19:21; T: 12/15/2012 21:06:34           I saw and examined the patient.  I reviewed the resident's note.  I agree with the findings and plan of care as documented in the resident's note.  Any exceptions/additions are edited/noted.    Harrison Mons, MD 12/28/2012, 7:55 AM

## 2012-12-20 ENCOUNTER — Ambulatory Visit (INDEPENDENT_AMBULATORY_CARE_PROVIDER_SITE_OTHER)
Admission: RE | Admit: 2012-12-20 | Discharge: 2012-12-20 | Disposition: A | Discharge status: Home / Self Care | Payer: BC Managed Care – PPO | Source: Ambulatory Visit | Attending: Sports Medicine | Admitting: Sports Medicine

## 2012-12-20 ENCOUNTER — Ambulatory Visit
Admission: RE | Admit: 2012-12-20 | Discharge: 2012-12-20 | Disposition: A | Discharge status: Home / Self Care | Payer: BC Managed Care – PPO | Source: Ambulatory Visit | Attending: Nuclear Radiology | Admitting: Nuclear Radiology

## 2012-12-20 DIAGNOSIS — S43006A Unspecified dislocation of unspecified shoulder joint, initial encounter: Secondary | ICD-10-CM | POA: Insufficient documentation

## 2012-12-20 MED ORDER — LIDOCAINE HCL 10 MG/ML (1 %) INJECTION SOLUTION
5.0000 mL | Freq: Once | INTRAMUSCULAR | Status: AC
Start: 2012-12-20 — End: 2012-12-20

## 2012-12-20 MED ORDER — ROPIVACAINE (PF) 5 MG/ML (0.5 %) INJECTION SOLUTION
INTRAMUSCULAR | Status: AC
Start: 2012-12-20 — End: 2012-12-20
  Filled 2012-12-20: qty 1

## 2012-12-20 MED ORDER — LIDOCAINE HCL 10 MG/ML (1 %) INJECTION SOLUTION
INTRAMUSCULAR | Status: AC
Start: 2012-12-20 — End: 2012-12-20
  Filled 2012-12-20: qty 20

## 2012-12-20 MED ORDER — ROPIVACAINE (PF) 5 MG/ML (0.5 %) INJECTION SOLUTION
5.0000 mL | Freq: Once | INTRAMUSCULAR | Status: AC
Start: 2012-12-20 — End: 2012-12-20

## 2012-12-20 MED ORDER — SODIUM CHLORIDE 0.9 % INJECTION SOLUTION
INTRAMUSCULAR | Status: AC
Start: 2012-12-20 — End: 2012-12-20
  Filled 2012-12-20: qty 10

## 2012-12-20 MED ORDER — SODIUM CHLORIDE 0.9 % INJECTION SOLUTION
5.0000 mL | Freq: Once | INTRAMUSCULAR | Status: AC
Start: 2012-12-20 — End: 2012-12-20

## 2012-12-20 MED ADMIN — iopamidoL 61 % intravenous solution: 1 mL | INTRA_ARTICULAR | NDC 00270131525

## 2012-12-20 MED ADMIN — sodium chloride 0.9 % injection solution: 5 mL | INTRA_ARTICULAR | NDC 63323018610

## 2012-12-20 MED ADMIN — gadopentetate dimeglumine 2.5 mmol/5 mL(469.01 mg/mL) intravenous soln: 0.05 mL | INTRA_ARTICULAR | NDC 50419018805

## 2012-12-20 MED ADMIN — ropivacaine (PF) 5 mg/mL (0.5 %) injection solution: 5 mL | INTRA_ARTICULAR | NDC 63323028630

## 2012-12-20 MED ADMIN — lidocaine HCL 10 mg/mL (1 %) injection solution: 50 mg | NDC 00409427601

## 2012-12-28 ENCOUNTER — Encounter (INDEPENDENT_AMBULATORY_CARE_PROVIDER_SITE_OTHER): Payer: BC Managed Care – PPO | Admitting: Sports Medicine

## 2013-01-17 ENCOUNTER — Other Ambulatory Visit (INDEPENDENT_AMBULATORY_CARE_PROVIDER_SITE_OTHER): Payer: Self-pay

## 2013-01-17 ENCOUNTER — Other Ambulatory Visit (HOSPITAL_COMMUNITY): Payer: Self-pay

## 2013-02-09 ENCOUNTER — Other Ambulatory Visit: Payer: Self-pay

## 2013-02-11 ENCOUNTER — Other Ambulatory Visit: Payer: Self-pay

## 2014-01-19 ENCOUNTER — Other Ambulatory Visit: Payer: Self-pay

## 2014-08-12 ENCOUNTER — Emergency Department (HOSPITAL_COMMUNITY): Payer: BC Managed Care – PPO

## 2014-08-12 ENCOUNTER — Emergency Department (EMERGENCY_DEPARTMENT_HOSPITAL): Payer: BC Managed Care – PPO

## 2014-08-12 ENCOUNTER — Encounter (HOSPITAL_COMMUNITY): Payer: Self-pay

## 2014-08-12 ENCOUNTER — Emergency Department
Admission: EM | Admit: 2014-08-12 | Discharge: 2014-08-12 | Disposition: A | Discharge status: Home / Self Care | Payer: BC Managed Care – PPO | Attending: Emergency Medicine | Admitting: Emergency Medicine

## 2014-08-12 DIAGNOSIS — R030 Elevated blood-pressure reading, without diagnosis of hypertension: Secondary | ICD-10-CM | POA: Insufficient documentation

## 2014-08-12 DIAGNOSIS — R519 Headache, unspecified: Secondary | ICD-10-CM

## 2014-08-12 DIAGNOSIS — R51 Headache: Secondary | ICD-10-CM

## 2014-08-12 DIAGNOSIS — R9431 Abnormal electrocardiogram [ECG] [EKG]: Secondary | ICD-10-CM

## 2014-08-12 HISTORY — DX: Other specified postprocedural states: Z98.890

## 2014-08-12 HISTORY — DX: Migraine, unspecified, not intractable, without status migrainosus: G43.909

## 2014-08-12 LAB — CBC/DIFF
BASOPHILS: 1 %
BASOS ABS: 0.036 10*3/uL (ref 0.000–0.200)
EOS ABS: 0.047 THOU/uL (ref 0.000–0.500)
EOSINOPHIL: 1 %
HCT: 50.3 % — ABNORMAL HIGH (ref 36.7–47.0)
HGB: 17.1 g/dL — ABNORMAL HIGH (ref 12.5–16.3)
LYMPHOCYTES: 46 %
LYMPHS ABS: 2.696 10*3/uL (ref 1.000–4.800)
MCH: 30.8 pg (ref 27.4–33.0)
MCHC: 34 g/dL (ref 32.5–35.8)
MCV: 90.5 fL (ref 78–100)
MONOCYTES: 6 %
MONOS ABS: 0.347 THOU/uL (ref 0.300–1.000)
MPV: 9.1 fL (ref 7.5–11.5)
PLATELET COUNT: 176 10*3/uL (ref 140–450)
PMN ABS: 2.796 10*3/uL (ref 1.500–7.700)
PMN'S: 46 %
RBC: 5.56 MIL/uL (ref 4.06–5.63)
RDW: 13.2 % (ref 12.0–15.0)
WBC: 5.9 THOU/uL (ref 3.5–11.0)

## 2014-08-12 LAB — ECG 12-LEAD
Atrial Rate: 68 {beats}/min
Calculated P Axis: 60 degrees
PR Interval: 124 ms
QRS Duration: 92 ms

## 2014-08-12 LAB — PT/INR
INR: 1.07 (ref 0.80–1.20)
PROTHROMBIN TIME: 12 s (ref 9.0–13.4)

## 2014-08-12 LAB — BASIC METABOLIC PANEL
ANION GAP: 12 mmol/L (ref 4–13)
BUN/CREAT RATIO: 13 (ref 6–22)
BUN: 15 mg/dL (ref 8–25)
CALCIUM: 9.6 mg/dL (ref 8.5–10.4)
CARBON DIOXIDE: 25 mmol/L (ref 22–32)
CHLORIDE: 105 mmol/L (ref 96–111)
CREATININE: 1.15 mg/dL (ref 0.62–1.27)
ESTIMATED GLOMERULAR FILTRATION RATE: >59 ml/min/1.73m2 (ref >59)
GLUCOSE,NONFAST: 89 mg/dL (ref 65–139)
POTASSIUM: 3.4 mmol/L — ABNORMAL LOW (ref 3.5–5.1)
SODIUM: 142 mmol/L (ref 136–145)

## 2014-08-12 LAB — PTT (PARTIAL THROMBOPLASTIN TIME): APTT: 28.8 s (ref 25.1–36.5)

## 2014-08-12 MED ORDER — SODIUM CHLORIDE 0.9 % IV BOLUS
1000.00 mL | INJECTION | Status: AC
Start: 2014-08-12 — End: 2014-08-12
  Administered 2014-08-12 (×2): 1000 mL via INTRAVENOUS

## 2014-08-12 MED ORDER — FENTANYL (PF) 50 MCG/ML INJECTION SOLUTION
100.00 ug | INTRAMUSCULAR | Status: AC
Start: 2014-08-12 — End: 2014-08-12
  Administered 2014-08-12: 100 ug via INTRAVENOUS
  Filled 2014-08-12: qty 2

## 2014-08-12 MED ORDER — METOCLOPRAMIDE 5 MG/ML INJECTION SOLUTION
5.00 mg | INTRAMUSCULAR | Status: AC
Start: 2014-08-12 — End: 2014-08-12
  Administered 2014-08-12: 5 mg via INTRAVENOUS
  Filled 2014-08-12: qty 2

## 2014-08-12 MED ORDER — IOPAMIDOL 370 MG IODINE/ML (76 %) INTRAVENOUS SOLUTION
50.00 mL | INTRAVENOUS | Status: AC
Start: 2014-08-12 — End: 2014-08-12
  Administered 2014-08-12: 15:00:00 50 mL via INTRAVENOUS

## 2014-08-12 MED ORDER — DIPHENHYDRAMINE 50 MG/ML INJECTION SOLUTION
25.00 mg | INTRAMUSCULAR | Status: AC
Start: 2014-08-12 — End: 2014-08-12
  Administered 2014-08-12: 25 mg via INTRAVENOUS
  Filled 2014-08-12: qty 1

## 2014-08-12 NOTE — ED Nurses Note (Signed)
IV started, labs held.

## 2014-08-12 NOTE — ED Nurses Note (Signed)
Migraine that started last night.  States he has not had one like this in the past.  Went to [facility] and had Toradol.  Headache is slightly better but not by much.

## 2014-08-12 NOTE — ED Provider Notes (Signed)
Attending Physician: Dr. Andres Allen  Mid-level provider: Sudie GrumblingNicole Daly Whipkey, PA-C    CC: headache  HPI  Mike Allen is a 39 y.o. male who presents to the ED via POV with c/o headache. Hx of migraines, current migraine does not feel similar. Pt reports at 2010 yesterday he had onset of worst headache in his lift. Describes headache as sharp, stabbing in R forehead. States his neck and back "locked up". Has photophobia, phonophobia, chills, and nausea. Denies vomiting. Pt took Ibuprofen, Magnesium, and fish oil last night with no relief. Reports he took more Ibuprofen and Magnesium and headache persisted. Pt went to [facility] this morning and was given IM Toradol. States he called chiropractor and had accupressure massage and adjustment which did not provide relief of headache. Denies any other sx or complaints. NKDA.      Review of Systems  Constitutional: No fever or weakness. +chills   Skin: No rash or diaphoresis  HENT: No congestion. +headache, +phonophobia  Eyes: +photophobia  Cardio: No chest pain, palpitations or leg swelling   Respiratory: No cough, wheezing or SOB  GI:  No abdominal pain, nausea, vomiting or stool changes  GU:  No urinary changes  MSK: No joint pain  Neuro: No seizures or LOC  All other systems reviewed and are negative.    Previous Medications    IBUPROFEN (MOTRIN) 200 MG TAB    take 200 mg by mouth Four times a day as needed.    MAGNESIUM ORAL    take  by mouth.    ZINC ORAL    take  by mouth.       History:   PMH:    Past Medical History   Diagnosis Date    Migraine     Migraines     Hx of shoulder surgery      PSH:  History reviewed. No pertinent past surgical history.  Social Hx:    History   Substance Use Topics    Smoking status: Never Smoker     Smokeless tobacco: Not on file    Alcohol Use: Yes      Comment: social     Family Hx: No family history on file.  Allergies: No Known Allergies      Above history reviewed with patient, changes are as documented.    Physical Exam      Nursing notes reviewed.    ED Triage Vitals   Enc Vitals Group      BP (Non-Invasive) 08/12/14 1238 210/98 mmHg      Heart Rate 08/12/14 1238 124      Respiratory Rate 08/12/14 1238 16      Temperature 08/12/14 1238 36.4 C (97.5 F)      Temp src --       SpO2-1 08/12/14 1238 100 %      Weight 08/12/14 1238 68.04 kg (150 lb)      Height 08/12/14 1238 1.778 m (5\' 10" )     Constitutional: NAD.  HENT:   Head: Normocephalic and atraumatic.   Mouth/Throat: Oropharynx is clear and moist.   Eyes: EOMI, PERRL. No nystagmus. Fundoscopic exam normal.    Neck: Trachea midline. Neck supple.  Cardiovascular: Mildly tachycardia with regular rhythm, No murmurs, rubs or gallops. Intact distal pulses.  Pulmonary/Chest: BS equal bilaterally. No respiratory distress. No wheezes, rales or chest tenderness.   Abdominal: BS +. Abdomen soft, no tenderness, rebound or guarding.  Musculoskeletal: No edema, tenderness or deformity.  Skin: warm and dry. No rash, erythema, pallor or cyanosis  Neurological: Alert and oriented x3. CN 2-12 grossly intact. Strength 5/5 in all extremities. SILT throughout. Negative Romberg's test.       Course  MDM      Impression/Plan: 39 y.o. male presenting with headache concerning for, but not limited to ICH vs CVA vs hypertensive urgency vs migraine. Medical Records reviewed.     Will obtain the following labs/imaging and give pt the following medications to alleviate symptoms:   Orders Placed This Encounter    URINE CULTURE,ROUTINE    CT ANGIO INTRACRANIAL W/WO IV CONTRAST    CBC/DIFF    BASIC METABOLIC PANEL, NON-FASTING    TROPONIN-I (FOR ED ONLY)    PT/INR    PTT (PARTIAL THROMBOPLASTIN TIME)    URINALYSIS WITH CULTURE REFLEX    URINALYSIS, MICROSCOPIC    ECG 12-LEAD    SCHEDULE FOLLOW-UP NEUROLOGY - PHYSICIAN OFFICE CENTER    NS bolus infusion 1,000 mL    fentaNYL (SUBLIMAZE) 50 mcg/mL injection    metoclopramide (REGLAN) 5 mg/mL injection    diphenhydrAMINE (BENADRYL) 50  mg/mL injection    iopamidol (ISOVUE-370) 76% infusion       Labs Reviewed   CBC/DIFF - Abnormal; Notable for the following:     HGB 17.1 (*)     HCT 50.3 (*)     All other components within normal limits   BASIC METABOLIC PANEL, NON-FASTING - Abnormal; Notable for the following:     POTASSIUM 3.4 (*)     All other components within normal limits   URINE CULTURE,ROUTINE   TROPONIN-I (FOR ED ONLY)   PT/INR   PTT (PARTIAL THROMBOPLASTIN TIME)   URINALYSIS WITH CULTURE REFLEX   URINALYSIS, MICROSCOPIC     All labs were reviewed.    Radiographical Imaging:  Results for orders placed or performed during the hospital encounter of 08/12/14 (from the past 72 hour(s))   CT ANGIO INTRACRANIAL W/WO IV CONTRAST     Status: None    Narrative    Mike Allen    CT ANGIO INTRACRANIAL W/WO IV CONTRAST performed on Aug 12, 2014  2:57 PM.       CLINICAL HISTORY: 39 y.o. male with worst HA of life, elevated BP.    INTRAVENOUS CONTRAST: 45ml's of Isovue 370.    FINDINGS: Noncontrast CT brain shows no intracranial hemorrhage or mass   effect.  Ventricular system is midline.  There is gross preservation of   the gray-white matter distinction.  Paranasal sinuses are clear.    Multiplanar CTA shows no evidence for atherosclerotic vascular disease.    There is no contour abnormality, filling defect, or significant stenosis   is seen in the intracranial vessels.      Impression     Unremarkable noncontrast CTA.  Unremarkable CTA head.         EKG: See Attending note.     Course:     Labs and CT neg. HA resolved after HA cocktail. BP normalized (122/76) without any antihypertensive medications. Offered LP to pt to definitively r/o SAH. Discussed risks and benefits. Pt declined at this time. He stated that he would prefer to be discharged home. He will return to ED if HA returns or with any new or worsening sx. Referral placed for neurology    Disposition:   Following the above history, physical exam, and studies, the patient was deemed  stable and suitable for  discharge. Medication instructions were discussed with the pt. The patient verbalized understanding of all instructions and had no further questions or concerns. Pt advised to return to the ED with any new or worsening symptoms.     Disposition: Discharged  Clinical Impression:   Encounter Diagnosis   Name Primary?    Headache Yes     Follow Up:   [facility] Emergency Department  [address]  [place] [address]  [phone]    As needed, If symptoms worsen    Neurology Clinic, [facility]  [address] [facility]  [place] [address]  [phone]        Prescriptions:   New Prescriptions    No medications on file         I am scribing for, and in the presence of, Mike Grumbling, PA-C for services provided on 08/12/2014.  Creig Hines, [place]    I personally performed the services described in this documentation, as scribed  in my presence, and it is both accurate  and complete.    Mike Grumbling, PA-C    The supervising physician was physically present and available for consultation, and did physically see the patient.    Mike Grumbling, PA-C  08/13/2014, 02:22

## 2014-08-12 NOTE — ED Nurses Note (Signed)
To CT scan

## 2014-08-12 NOTE — ED Nurses Note (Signed)
Medicated as ordered.  Will check VS when pain relieved.

## 2014-08-12 NOTE — ED Attending Note (Signed)
I personally saw and examined the patient.  See mid-level's note for additional details.  My findings are the patient is a 39 y.o. male with a HA and hypertension.  Further historical details can be found in the mid-level provider's note.    PE:   Filed Vitals:    08/12/14 1238   BP: 210/98   Pulse: 124   Temp: 36.4 C (97.5 F)   Resp: 16   Height: 1.778 m (5\' 10" )   Weight: 68.04 kg (150 lb)   SpO2: 100%     Alert and oriented x 3.  HEENT: AT/NC. PERRL. EOMI.  Normal fundoscopic exam OU.  Extrems: Brisk capillary refill.  No deformities.  Neurologic: Cranial nerves II through XII fully intact.  Normal motor and sensory function of the upper and lower extremities bilaterally.  Normal speech and comprehension.  Normal cerebellar function.  Skin: Warm, pink, dry.    Diagnostic tests reviewed:  Results up to the Time the Disposition was Entered   CBC/DIFF - Abnormal; Notable for the following:     HGB 17.1 (*)     HCT 50.3 (*)     All other components within normal limits   TROPONIN-I (FOR ED ONLY)   ECG 12-LEAD   PT/INR   PTT (PARTIAL THROMBOPLASTIN TIME)   BASIC METABOLIC PANEL, NON-FASTING   URINALYSIS WITH CULTURE REFLEX   URINALYSIS, MICROSCOPIC   URINE CULTURE,ROUTINE   CT ANGIO INTRACRANIAL W/WO IV CONTRAST   NS bolus infusion 1,000 mL (1,000 mL Intravenous New Bag/New Syringe 08/12/14 1405)   fentaNYL (SUBLIMAZE) 50 mcg/mL injection (100 mcg Intravenous Given 08/12/14 1401)   metoclopramide (REGLAN) 5 mg/mL injection (5 mg Intravenous Given 08/12/14 1402)   diphenhydrAMINE (BENADRYL) 50 mg/mL injection (25 mg Intravenous Given 08/12/14 1403)   iopamidol (ISOVUE-370) 76% infusion (50 mL Intravenous Given 08/12/14 1431)       Results for orders placed or performed during the hospital encounter of 08/12/14 (from the past 12 hour(s))   CBC/DIFF   Result Value Ref Range    WBC 5.9 3.5 - 11.0 THOU/uL    RBC 5.56 4.06 - 5.63 MIL/uL    HGB 17.1 (H) 12.5 - 16.3 g/dL    HCT 86.550.3 (H) 78.436.7 - 47.0 %    MCV 90.5 78 - 100 fL    MCH 30.8 27.4 - 33.0 pg    MCHC 34.0 32.5 - 35.8 g/dL    RDW 69.613.2 29.512.0 - 28.415.0 %    PLATELET COUNT 176 140 - 450 THOU/uL    MPV 9.1 7.5 - 11.5 fL    PMN'S 46 %    PMN ABS 2.796 1.500 - 7.700 THOU/uL    LYMPHOCYTES 46 %    LYMPHS ABS 2.696 1.000 - 4.800 THOU/uL    MONOCYTES 6 %    MONOS ABS 0.347 0.300 - 1.000 THOU/uL    EOSINOPHIL 1 %    EOS ABS 0.047 0.000 - 0.500 THOU/uL    BASOPHILS 1 %    BASOS ABS 0.036 0.000 - 0.200 THOU/uL   BASIC METABOLIC PANEL, NON-FASTING   Result Value Ref Range    SODIUM 142 136 - 145 mmol/L    POTASSIUM 3.4 (L) 3.5 - 5.1 mmol/L    CHLORIDE 105 96 - 111 mmol/L    CARBON DIOXIDE 25 22 - 32 mmol/L    ANION GAP 12 4 - 13 mmol/L    CREATININE 1.15 0.62 - 1.27 mg/dL    ESTIMATED GLOMERULAR FILTRATION RATE >59 >59  ml/min/1.61m2    GLUCOSE,NONFAST 89 65 - 139 mg/dL    BUN 15 8 - 25 mg/dL    BUN/CREAT RATIO 13 6 - 22    CALCIUM 9.6 8.5 - 10.4 mg/dL   TROPONIN-I (FOR ED ONLY)   Result Value Ref Range    TROPONIN-I <7 <30 ng/L   PT/INR   Result Value Ref Range    PROTHROMBIN TIME 12.0 9.0 - 13.4 Sec    INR 1.07 0.80 - 1.20   PTT (PARTIAL THROMBOPLASTIN TIME)   Result Value Ref Range    APTT 28.8 25.1 - 36.5 Sec        EKG: Normal sinus rhythm. No evidence of acute changes.    Results for orders placed or performed during the hospital encounter of 08/12/14 (from the past 72 hour(s))   CT ANGIO INTRACRANIAL W/WO IV CONTRAST     Status: None    Narrative    Lash Linnen    CT ANGIO INTRACRANIAL W/WO IV CONTRAST performed on Aug 12, 2014  2:57 PM.       CLINICAL HISTORY: 39 y.o. male with worst HA of life, elevated BP.    INTRAVENOUS CONTRAST: 1ml's of Isovue 370.    FINDINGS: Noncontrast CT brain shows no intracranial hemorrhage or mass   effect.  Ventricular system is midline.  There is gross preservation of   the gray-white matter distinction.  Paranasal sinuses are clear.    Multiplanar CTA shows no evidence for atherosclerotic vascular disease.    There is no contour abnormality, filling  defect, or significant stenosis   is seen in the intracranial vessels.      Impression     Unremarkable noncontrast CTA.  Unremarkable CTA head.         MDM:  Fentanyl, Reglan, and Benadryl were administered in the ED.  The patient felt significantly better after administration of these agents. He will be strictly instructed to return to the emergency department for any new or recurrent symptoms.    Risks, benefits, and indications of lumbar puncture, including but not limited to definitive identification of SAH/meningitis, as well as potential alternatives, were discussed with and understood by patient.  Patient stated understanding of these and elects to defer lumbar puncture at this time.    Larey Seat, MD  08/12/2014, 16:19    I have reviewed the assessment and management plans and agree with the medical evaluation/clinical care of the patient.      Impression:  Headache - resolved.    Plan:  Follow-up in neurology clinic for further evaluation of headaches. Return to emergency department immediately for any new or recurrent symptoms.  The patient has been provided the standardized instructions on headache.

## 2014-08-12 NOTE — ED Nurses Note (Signed)
Patient given written and verbal discharge instructions. Verbalized understanding. No questions at this time. PIV removed with catheter intact.

## 2014-08-13 ENCOUNTER — Emergency Department (HOSPITAL_COMMUNITY): Payer: BC Managed Care – PPO

## 2014-08-13 ENCOUNTER — Emergency Department
Admission: EM | Admit: 2014-08-13 | Discharge: 2014-08-13 | Disposition: A | Discharge status: Home / Self Care | Payer: BC Managed Care – PPO | Attending: Emergency Medicine | Admitting: Emergency Medicine

## 2014-08-13 DIAGNOSIS — I10 Essential (primary) hypertension: Secondary | ICD-10-CM | POA: Insufficient documentation

## 2014-08-13 DIAGNOSIS — R110 Nausea: Secondary | ICD-10-CM | POA: Insufficient documentation

## 2014-08-13 DIAGNOSIS — R519 Headache, unspecified: Secondary | ICD-10-CM

## 2014-08-13 DIAGNOSIS — R51 Headache: Secondary | ICD-10-CM | POA: Insufficient documentation

## 2014-08-13 LAB — CBC/DIFF
BASOPHILS: 1 %
BASOS ABS: 0.032 THOU/uL (ref 0.000–0.200)
EOS ABS: 0.073 10*3/uL (ref 0.000–0.500)
EOSINOPHIL: 1 %
HCT: 46.2 % (ref 36.7–47.0)
HGB: 16 g/dL (ref 12.5–16.3)
LYMPHOCYTES: 49 %
LYMPHS ABS: 2.493 10*3/uL (ref 1.000–4.800)
MCH: 31 pg (ref 27.4–33.0)
MCHC: 34.6 g/dL (ref 32.5–35.8)
MCV: 89.7 fL (ref 78–100)
MONOCYTES: 8 %
MONOS ABS: 0.412 THOU/uL (ref 0.300–1.000)
MPV: 8.7 fL (ref 7.5–11.5)
PLATELET COUNT: 161 THOU/uL (ref 140–450)
PMN ABS: 2.108 10*3/uL (ref 1.500–7.700)
PMN'S: 41 %
RBC: 5.15 MIL/uL (ref 4.06–5.63)
RDW: 12.8 % (ref 12.0–15.0)
WBC: 5.1 10*3/uL (ref 3.5–11.0)

## 2014-08-13 LAB — GLUCOSE CSF: GLUCOSE, CSF: 59 mg/dL (ref 50–80)

## 2014-08-13 LAB — HERPES SIMPLEX VIRUS (HSV1/HSV2), PCR, CSF OR SWAB: HERPES SIMPLEX VIRUS (HSV) PCR: NEGATIVE

## 2014-08-13 LAB — CELL COUNT, CSF
NUCLEATED CELLS: 1 /uL (ref <5)
RBC COUNT: 1 /uL — ABNORMAL HIGH
RBC'S ON SLIDE: NONE SEEN
TOTAL CELL COUNT: 0

## 2014-08-13 LAB — PROTEIN CSF: PROTEIN, CSF: 23 mg/dL (ref 15–45)

## 2014-08-13 MED ORDER — ONDANSETRON HCL (PF) 4 MG/2 ML INJECTION SOLUTION
4.00 mg | INTRAMUSCULAR | Status: AC
Start: 2014-08-13 — End: 2014-08-13
  Administered 2014-08-13: 4 mg via INTRAVENOUS
  Filled 2014-08-13: qty 2

## 2014-08-13 MED ORDER — METOCLOPRAMIDE 5 MG TABLET
5.00 mg | ORAL_TABLET | Freq: Four times a day (QID) | ORAL | Status: AC | PRN
Start: 2014-08-13 — End: ?

## 2014-08-13 MED ORDER — MAGNESIUM SULFATE 2 GRAM/50 ML (4 %) IN WATER INTRAVENOUS PIGGYBACK
2.0000 g | INJECTION | Freq: Once | INTRAVENOUS | Status: AC
Start: 2014-08-13 — End: 2014-08-13
  Administered 2014-08-13: 0 g via INTRAVENOUS
  Administered 2014-08-13: 2 g via INTRAVENOUS
  Filled 2014-08-13: qty 50

## 2014-08-13 MED ORDER — METHYLPREDNISOLONE SOD SUCC 125 MG SOLUTION FOR INJECTION WRAPPER
125.00 mg | INTRAVENOUS | Status: AC
Start: 2014-08-13 — End: 2014-08-13
  Administered 2014-08-13: 125 mg via INTRAVENOUS
  Filled 2014-08-13: qty 2

## 2014-08-13 MED ORDER — SODIUM CHLORIDE 0.9 % IV BOLUS
1000.00 mL | INJECTION | Status: AC
Start: 2014-08-13 — End: 2014-08-13
  Administered 2014-08-13: 1000 mL via INTRAVENOUS
  Administered 2014-08-13: 0 mL via INTRAVENOUS

## 2014-08-13 MED ORDER — DIPHENHYDRAMINE 50 MG CAPSULE
50.00 mg | ORAL_CAPSULE | Freq: Four times a day (QID) | ORAL | Status: AC | PRN
Start: 2014-08-13 — End: ?

## 2014-08-13 MED ADMIN — magnesium sulfate 2 gram/50 mL (4 %) in water intravenous piggyback: INTRAVENOUS | @ 05:00:00

## 2014-08-13 MED ADMIN — heparin (porcine) (PF) 1,000 unit/500 mL in 0.9 % sodium chloride IV: INTRAVENOUS | @ 04:00:00 | NDC 24200269083

## 2014-08-13 NOTE — Ancillary Notes (Signed)
Pt called with questions. Reported that he is having back pain around the lumbar puncture site. The pain is significant enough that he is asking if he can take Norco for the pain. Instructed pt that if his pain is significant enough at a lumbar puncture site to warrant opioid use then he should have the site evaluated. Unknown JimRita Lynn KirbyHunsucker, APRN  08/13/2014, 15:26

## 2014-08-13 NOTE — ED Resident Handoff Note (Signed)
Code Status: Full    No Known Allergies    Filed Vitals:    08/12/14 2253   BP: 154/96   Pulse: 93   Temp: 36.5 C (97.7 F)   Resp: 20   Height: 1.778 m (5\' 10" )   Weight: 71.1 kg (156 lb 12 oz)   SpO2: 99%       HPI:  In brief, patient is a 39 y.o. male who presents to the emergency department due to HA. Was seen in ED yesterday for same. CTA intra/extracranial done at previous visit and negative. Has h/o migraines. This is different from typical migraines. Resolved at previous visit, but steadily returned throughout the day.     Pertinent Exam Findings:  CTAB  RRR  EOMI  No neuro deficits    Pertinent Imaging/Lab results:  Labs Reviewed   CSF CULTURE WITH GRAM STAIN   CBC/DIFF   CELL COUNT, CSF   GLUCOSE CSF   HERPES SIMPLEX VIRUS (HSV1/HSV2), PCR, CSF OR SWAB   PROTEIN CSF       Pending Studies:  CSF studies    Consults:  none    Plan:  HA cocktail  Pending CSF cultures  After a thorough discussion of the patient including presentation, ED course, and review of above information I have assumed care of Mike Allen from Dr. Thomasena Edisollins at [facility]02:22    Vennela Jutte Elizabeth Moore Rapp, MD    Course:  - CSF studies negative. Reviewed prior images and labs.   - Given HA cocktail with improvement. Will Rx prednisone for DC  - Exam non-focal. Medically suitable and stable. Given referral to Neurology  - All questions and concerns addressed. Expressed understanding of instructions.     Evonnie DawesHaley Elizabeth Moore Rapp, MD 08/13/2014, 02:22  PGY 3  [facility]

## 2014-08-13 NOTE — ED Nurses Note (Signed)
Magnesium infusing at this time.  Pt verbalized decreased headache down to 2/10.  hm

## 2014-08-13 NOTE — Procedures (Addendum)
Encounter Date: 08/12/2014    Emergency Department Procedure:    Lumbar Puncture  Procedure performed at: 0150  Patient positioning: Sitting position  Local Anesthesia: Lidocaine Plain  Skin was prepped with: Betadine, ChloraPrep  Pre-Procedure Documentation: Skin was prepped and draped, Sterile technique was used, Physician's skills were required  Needle/Catheter Gauge: 20 gauge  Number of attempts: 1  Total fluid removed: 4 cc  Fluid was grossly: Clear  Attending Physician: Purvis Sheffieldebra Jamaya Sleeth  Comments: Patient tolerated procedure well with some mild nausea following. Was given zofran for nausea control. Instructed to lay down following procedure.       Severiano GilbertKristina M Collins, MD  08/13/2014, 17:40    I was physically present in the Emergency Department and supervised the entire procedure(s) as documented.    Odis Lusterebra Jo Nissan Frazzini, MD 08/14/2014, 20:58

## 2014-08-13 NOTE — ED Attending Note (Signed)
Department of Emergency Medicine  Note begun by:  Odis Luster, MD 08/13/2014, 01:15  Documentation time:   20:50                                20:58                      I was physically present and directly supervised this patient's care.  Patient seen and examined with Dr. Thomasena Edis, history and exam reviewed.   Key elements in addition to and/or correction of that documentation are as follows:    HPI :      39 y.o. male presents with chief complaint of headache.     Seen earlier with HA  Resolved with reglan, fentanyl and benadryl  STarted yesterday at 8 pm, moderate onset, rapidly worse on 25th   Took ibuprofen went away, got into the shower  Came in on the 26th  CTA and labs obtained  Offered LP at that visit.  This evening started to slowly return  Long hx of HA, but headache worse than he is used to.    Headaches previously managed with ibuprofen and 500 mg magnesium.    NO FH of aneurysm/SAH.  Followed with Dr. Claudette Laws, but has not seen him for 8 years  Previously on imitrex, but did not tolerate side effects  On amitryptyline but he said he was emotionless on that medication    Per triage note:  "Headache here earlier this afternoon for same with a full work-up, states now pain/head pressure is coming back. +nausea. "  Past Medical History:  Past Medical History   Diagnosis Date    Migraine     Migraines     Hx of shoulder surgery        Past Surgical History:  No past surgical history on file.    Social History:  History   Substance Use Topics    Smoking status: Never Smoker     Smokeless tobacco: Not on file    Alcohol Use: Yes      Comment: social     History   Drug Use No       Family History:  No family history on file.    PE :       ED Triage Vitals   Enc Vitals Group      BP (Non-Invasive) 08/12/14 2253 154/96 mmHg      Heart Rate 08/12/14 2253 93      Respiratory Rate 08/12/14 2253 20      Temperature 08/12/14 2253 36.5 C (97.7 F)      Temp src --       SpO2-1 08/12/14 2253 99 %    Weight 08/12/14 2253 71.1 kg (156 lb 12 oz)      Height 08/12/14 2253 1.778 m ( )      Head Cir --       Peak Flow --       Pain Score --       Pain Loc --       Pain Edu? --       Excl. in GC? --      Alert and oriented  Skin warm and dry  Mucous membranes moist  Neck supple  Lungs clear  Heart RRR  Abdomen NT, soft, normal bowel sounds  Nl pulses  Moves all extremities well  Sensation grossly intact  Data/Test :    EKG :   None  Images Review by me :   None  Image Reports Review by me :   None  Labs :   Results for orders placed or performed during the hospital encounter of 08/13/14 (from the past 12 hour(s))   CBC/DIFF   Result Value Ref Range    WBC 5.1 3.5 - 11.0 THOU/uL    RBC 5.15 4.06 - 5.63 MIL/uL    HGB 16.0 12.5 - 16.3 g/dL    HCT 57.8 46.9 - 62.9 %    MCV 89.7 78 - 100 fL    MCH 31.0 27.4 - 33.0 pg    MCHC 34.6 32.5 - 35.8 g/dL    RDW 52.8 41.3 - 24.4 %    PLATELET COUNT 161 140 - 450 THOU/uL    MPV 8.7 7.5 - 11.5 fL    PMN'S 41 %    PMN ABS 2.108 1.500 - 7.700 THOU/uL    LYMPHOCYTES 49 %    LYMPHS ABS 2.493 1.000 - 4.800 THOU/uL    MONOCYTES 8 %    MONOS ABS 0.412 0.300 - 1.000 THOU/uL    EOSINOPHIL 1 %    EOS ABS 0.073 0.000 - 0.500 THOU/uL    BASOPHILS 1 %    BASOS ABS 0.032 0.000 - 0.200 THOU/uL   CELL COUNT, CSF   Result Value Ref Range    CHARACTERISTIC CLEAR     COLOR COLORLESS     RBC COUNT 1 (H) 0 /UL    NUCLEATED CELLS 1 <5 /UL    RBC'S ON SLIDE NONE SEEN NONE SEEN    OTHER CELLS  %     Quantitative differential not performed.  Please refer to qualitative  findings.      BLASTS  %     Quantitative differential not performed.  Please refer to qualitative  findings.      PMN'S  0 %     Quantitative differential not performed.  Please refer to qualitative  findings.      LYMPHOCYTES  %     Quantitative differential not performed.  Please refer to qualitative  findings.      MONONUCLEARS  %     Quantitative differential not performed.  Please refer to qualitative  findings.      EOSINOPHILS   %     Quantitative differential not performed.  Please refer to qualitative  findings.      MACROPHAGES  %     Quantitative differential not performed.  Please refer to qualitative  findings.      BASOPHILS  %     Quantitative differential not performed.  Please refer to qualitative  findings.      TOTAL CELL COUNT 0     DIFFERENTIAL LYMPHOCYTES-  RARE  MONONUCLEARS-  RARE      GLUCOSE CSF   Result Value Ref Range    GLUCOSE, CSF 59 50 - 80 mg/dL   PROTEIN CSF   Result Value Ref Range    PROTEIN, CSF 23 15 - 45 mg/dL   CSF CULTURE WITH GRAM STAIN   Result Value Ref Range    SPECIMEN DESCRIPTION CEREBROSPINAL FLUID  LUMBAR PUNCTURE       SPECIAL REQUESTS NONE     GRAM STAIN       NO PMNS SEEN  NO ORGANISMS SEEN  RESULTS ARE FROM CYTOCENTRIFUGED SPECIMEN.      CULTURE OBSERVATION PENDING     REPORT  STATUS PENDING          Review of Prior Data :       Prior Images :   Ct Angio Intracranial W/wo Iv Contrast  Noncontrast CT brain shows no intracranial hemorrhage or mass  effect.  Ventricular system is midline.  There is gross preservation of  the gray-white matter distinction.  Paranasal sinuses are clear.  Multiplanar CTA shows no evidence for atherosclerotic vascular disease.   There is no contour abnormality, filling defect, or significant stenosis  is seen in the intracranial vessels.   08/12/2014    Unremarkable noncontrast CTA.  Unremarkable CTA head.     Prior EKG : None  Online Medical Records :  Results for orders placed or performed during the hospital encounter of 08/12/14 (from the past 24 hour(s))   CBC/DIFF    Collection Time: 08/12/14  1:45 PM   Result Value Ref Range    WBC 5.9 3.5 - 11.0 THOU/uL    RBC 5.56 4.06 - 5.63 MIL/uL    HGB 17.1 (H) 12.5 - 16.3 g/dL    HCT 16.1 (H) 09.6 - 47.0 %    MCV 90.5 78 - 100 fL    MCH 30.8 27.4 - 33.0 pg    MCHC 34.0 32.5 - 35.8 g/dL    RDW 04.5 40.9 - 81.1 %    PLATELET COUNT 176 140 - 450 THOU/uL    MPV 9.1 7.5 - 11.5 fL    PMN'S 46 %    PMN ABS 2.796 1.500 - 7.700  THOU/uL    LYMPHOCYTES 46 %    LYMPHS ABS 2.696 1.000 - 4.800 THOU/uL    MONOCYTES 6 %    MONOS ABS 0.347 0.300 - 1.000 THOU/uL    EOSINOPHIL 1 %    EOS ABS 0.047 0.000 - 0.500 THOU/uL    BASOPHILS 1 %    BASOS ABS 0.036 0.000 - 0.200 THOU/uL   BASIC METABOLIC PANEL, NON-FASTING    Collection Time: 08/12/14  1:45 PM   Result Value Ref Range    SODIUM 142 136 - 145 mmol/L    POTASSIUM 3.4 (L) 3.5 - 5.1 mmol/L    CHLORIDE 105 96 - 111 mmol/L    CARBON DIOXIDE 25 22 - 32 mmol/L    ANION GAP 12 4 - 13 mmol/L    CREATININE 1.15 0.62 - 1.27 mg/dL    ESTIMATED GLOMERULAR FILTRATION RATE >59 >59 ml/min/1.65m2    GLUCOSE,NONFAST 89 65 - 139 mg/dL    BUN 15 8 - 25 mg/dL    BUN/CREAT RATIO 13 6 - 22    CALCIUM 9.6 8.5 - 10.4 mg/dL   TROPONIN-I (FOR ED ONLY)    Collection Time: 08/12/14  1:45 PM   Result Value Ref Range    TROPONIN-I <7 <30 ng/L   PT/INR    Collection Time: 08/12/14  1:45 PM   Result Value Ref Range    PROTHROMBIN TIME 12.0 9.0 - 13.4 Sec    INR 1.07 0.80 - 1.20   PTT (PARTIAL THROMBOPLASTIN TIME)    Collection Time: 08/12/14  1:45 PM   Result Value Ref Range    APTT 28.8 25.1 - 36.5 Sec   ECG 12-LEAD    Collection Time: 08/12/14  2:01 PM   Result Value Ref Range    Ventricular rate 68 BPM    Atrial Rate 68 BPM    PR Interval 124 ms    QRS Duration 92 ms    QT  Interval 380 ms    QTC Calculation 404 ms    Calculated P Axis 60 degrees    Calculated R Axis 68 degrees    Calculated T Axis 57 degrees       Transfer Docs/Images : None    Clinical Impression :     1. Headache    2. Hypertension      MDM & ED Course & Plan :     Orders Placed This Encounter    CSF CULTURE WITH GRAM STAIN    CBC/DIFF    CELL COUNT WITH DIFF -  Tube 4    GLUCOSE CSF    HSV1/HSV2, PCR, CSF, SWAB    PROTEIN CSF    CANCELED: CELL COUNT, CSF    ondansetron (ZOFRAN) 2 mg/mL injection    NS bolus infusion 1,000 mL    magnesium sulfate 2 G in SW 50 mL premix IVPB    methylPREDNISolone sod succ (SOLU-MEDROL) 125 mg/2 mL injection      Previously studies reviewed  Treated for HA with magnesium and solumedrol  Also given zofran  Patient had LP performed  No evidence of SAH or meningitis  D/W patient that I did not feel that neurology would perform further studies at present with nl studies and resolution of HA  Filed Vitals:    08/12/14 2253 08/13/14 0329 08/13/14 0456   BP: 154/96 115/70 113/70   Pulse: 93 69 78   Temp: 36.5 C (97.7 F)     Resp: 20 16 20    Height: 1.778 m (5\' 10" )     Weight: 71.1 kg (156 lb 12 oz)     SpO2: 99% 98% 98%     D/W patient that he needs primary care to follow BP  Dispo :     Discharged  CRITICAL CARE : None    This note was prepared in a retrospective fashion given the demands for hands-on patient care at the time of the patient encounter.

## 2014-08-13 NOTE — ED Nurses Note (Signed)
Pt. Was seen here earlier this evening with c/o headache.  States the pain is coming back and he wants checked again.  Pt. Is alert and oriented.  Resident in room doing spinal tap.  Pt. Is alert and oriented.  Friend with pt.

## 2014-08-13 NOTE — Discharge Instructions (Signed)
Please use ice, heat, over the counter pain medications such as tylenol or ibuprofen if instructed not to avoid these, for your symptoms.    Please take all medications you were prescriptions for as prescribed for your symptoms or diagnoses.    Please follow up with any specialist provider in clinic that your were given instructions to see as an outpatient.    Please follow up with your primary care physician as scheduled tomorrow.     Please return to the Emergency department if you have persistence or worsening of your symptoms.      General Instructions:   You are considered stable for discharge from the emergency department.  Please carefully follow all the instructions you were given verbally as well as the written instructions given below.  In general, immediately return to the emergency department if the symptoms you presented with today increase in severity, change in any way, and/or do not improve in what you consider an acceptable time frame.  Return if you develop fever >101, vomiting, oral liquid intolerance, chest pain, shortness of breath, weakness, change in behavior, or any other concerns.    Medication(s) Instructions (if applicable):   If you were given any medication(s) upon discharge, please strictly follow the directions as prescribed for taking the medication(s).  Should you feel you develop any type of reaction to the medication(s), including, but not limited to, rash, swelling of the lips or face, or difficulty breathing, immediately discontinue the use of the medication(s) and seek prompt medical care.  Please read the medication(s) insert provided by the pharmacy and follow all guidelines and recommendations.       Follow-Up Instructions:   If you were given instructions to follow-up with a health care provider upon discharge, please be sure to do so.  It is your responsibility to call and/or make an appointment with the health care providers listed on your discharge papers and/or your  primary care provider in the appropriate time frame given.  Please take a copy of your discharge papers with you to your follow-up appointment(s).       YOU MUST CALL THE NUMBER LISTED ON THE DISCHARGE PAPERWORK TO CONFIRM YOUR APPOINTMENT.      Special Information / Instructions:     Please read and follow all attached discharge instructions.      Please call the [facility] at [phone] with any questions or concerns.

## 2014-08-13 NOTE — ED Nurses Note (Signed)
Pt verbalized feeling better after magnesium infusion.  Pt discharged to home with instructions.  Written rx reglan and benadryl given.  Pt verbalized understanding.  Denies question or concern.  Pt accompanied by spouse.  hm

## 2014-08-13 NOTE — ED Provider Notes (Signed)
Emergency Department    CC:  Headache    HPI:  Mike Allen is a 39 y.o. male who presents with headache. The patient has a history of migraines, however, this is more significant than previous. He was seen yesterday for headache and treated. He states that initially that helped significant and declined an LP at that point in time.  However after getting home, the headache began to return and this time he decided he should come back in.  He has attempted to treat with multiple doses of  Ibuprofen. Which have not helped. HA is right sided. He complains of associated nausea and neck stiffness. No neurological symptoms noted. Patient has previously seen neurology for his headaches.     Initial onset of headache was the 25th developed quickly, but did not come on suddenly.  He was seen on the 4/26 mid day.  HA cocktail resolved.  Returned home. Through day with mild headache which he treated with ibuprofen and had minimal relief.  Took a shower and the headache got much worse.        ROS:  CONSTITUTIONAL: no fever or chills, no fatigue  NEURO: no weakness, no parethesias  HEENT: + headache, no sore throat + neck stiffness  CARDIOVASCULAR: no chest pain  RESPIRATORY: no shortness of breath, no cough  GI: + nausea, vomiting, or diarrhea  GU: no dysuria  MUSCULOSKELETAL: no joint pain  SKIN: no rash  MENTAL: no depression or anxiety  ENDOCRINE: no history of diabetes  HEME/LYMPH: no history of clots  ALLERGY: no history of anaphylaxis    Medications:  Medications Prior to Admission     Prescriptions    Ibuprofen (MOTRIN) 200 mg Tab    take 200 mg by mouth Four times a day as needed.    MAGNESIUM ORAL    take  by mouth.    ZINC ORAL    take  by mouth.    HYDROcodone-acetaminophen (NORCO) 5-325 mg Oral Tablet    Take 1-2 Tabs by mouth Every 4 hours as needed for Pain          Allergies:  No Known Allergies   Allergies reviewed with patient    Past Medical History   Diagnosis Date    Migraine     Migraines     Hx of  shoulder surgery          History reviewed with patient    No past surgical history on file.        History   Substance Use Topics    Smoking status: Never Smoker     Smokeless tobacco: Not on file    Alcohol Use: Yes      Comment: social       Physical Exam:  All nurse's notes reviewed.  ED Triage Vitals   Enc Vitals Group      BP (Non-Invasive) 08/12/14 2253 154/96 mmHg      Heart Rate 08/12/14 2253 93      Respiratory Rate 08/12/14 2253 20      Temperature 08/12/14 2253 36.5 C (97.7 F)      Temp src --       SpO2-1 08/12/14 2253 99 %      Weight 08/12/14 2253 71.1 kg (156 lb 12 oz)      Height 08/12/14 2253 1.778 m (5\' 10" )      Head Cir --       Peak Flow --       Pain  Score --       Pain Loc --       Pain Edu? --       Excl. in GC? --        Constitutional: NAD. Oriented appear uncomfortable.   HENT:   Head: Normocephalic and atraumatic.   Mouth/Throat: Oropharynx is clear and moist.   Eyes: PERRL, EOMI, conjunctivae without discharge bilaterally  Neck: Trachea midline.   Cardiovascular: RRR, No murmurs, rubs or gallops.   Pulmonary/Chest: BS equal bilaterally, good air movement. No respiratory distress. No wheezes, rales or chest tenderness.   Abdominal: BS +. Abdomen soft, no tenderness, rebound or guarding.     Musculoskeletal: No edema, tenderness or deformity.  Skin: warm and dry. No rash, erythema, pallor or cyanosis  Psychiatric: normal mood and affect. Behavior is normal.   Neurological: Alert&Ox3. Grossly intact. No deficits noted    ED Orders/Labs/Imaging:   Results up to the Time the Disposition was Entered   VITAL SIGNS: PER UNIT'S ROUTINE   CBC/DIFF   CELL COUNT, CSF   GLUCOSE CSF   HERPES SIMPLEX VIRUS (HSV1/HSV2), PCR, CSF OR SWAB   PROTEIN CSF   CSF CULTURE WITH GRAM STAIN   NS bolus infusion 1,000 mL (1,000 mL Intravenous New Bag/New Syringe 08/13/14 0205)   magnesium sulfate 2 G in SW 50 mL premix IVPB (not administered)   methylPREDNISolone sod succ (SOLU-MEDROL) 125 mg/2 mL injection  (not administered)   ondansetron (ZOFRAN) 2 mg/mL injection (4 mg Intravenous Given 08/13/14 0205)          Therapy/Procedures/Course/MDM:   Vital signs: VSS, mildly hypertensive  Medications required: Magnesium, Zofran, IVF, Solumedrol  Course: Patient with HA, previously seen for same. Worse than normal migraines. Offered LP.  Of note CTA I/E done yesterday and negative. Exam was unremarkable.     LP completed (see note). CSF studies pending. Patient given HA cocktail with improvement.     Patient remained in ED at time of sign out. CSF studies pending. Patient HPI, details and plan were signed out to Dr. Vanita Panda. Please see her ED resident course note for additional details.   Consults: None  Impression/Plan: Patient is a 39 y.o. male with   Encounter Diagnoses   Name Primary?    Headache Yes    Hypertension        Disposition:      Patient HPI, details and plan were signed out to Dr. Vanita Panda. Please see her ED resident course note for additional details.     Severiano Gilbert, MD 09/27/2014, 14:40  PGY II  [facility]

## 2014-08-13 NOTE — ED Nurses Note (Signed)
Placed 20g in RAC, labs sent.  Gave Zofran 4mg  and Solumedrol.  Started NS infusion.  Pt. Moved to Room 5.  Report to Vic BlackbirdH. Morgan, RN.

## 2014-08-17 ENCOUNTER — Emergency Department
Admission: EM | Admit: 2014-08-17 | Discharge: 2014-08-17 | Disposition: A | Discharge status: Home / Self Care | Payer: BC Managed Care – PPO | Attending: Emergency Medicine | Admitting: Emergency Medicine

## 2014-08-17 ENCOUNTER — Encounter (HOSPITAL_COMMUNITY): Payer: Self-pay

## 2014-08-17 DIAGNOSIS — G43901 Migraine, unspecified, not intractable, with status migrainosus: Secondary | ICD-10-CM | POA: Insufficient documentation

## 2014-08-17 LAB — CBC/DIFF
BASOPHILS: 1 %
BASOS ABS: 0.022 10*3/uL (ref 0.000–0.200)
EOS ABS: 0.053 10*3/uL (ref 0.000–0.500)
EOSINOPHIL: 2 %
HCT: 51 % — ABNORMAL HIGH (ref 36.7–47.0)
HGB: 18 g/dL — ABNORMAL HIGH (ref 12.5–16.3)
LYMPHOCYTES: 38 %
LYMPHS ABS: 1.388 10*3/uL (ref 1.000–4.800)
MCH: 31.2 pg (ref 27.4–33.0)
MCHC: 35.3 g/dL (ref 32.5–35.8)
MCV: 88.2 fL (ref 78–100)
MONOCYTES: 6 %
MONOS ABS: 0.232 THOU/uL — ABNORMAL LOW (ref 0.300–1.000)
MPV: 8.6 fL (ref 7.5–11.5)
PLATELET COUNT: 188 10*3/uL (ref 140–450)
PMN ABS: 1.995 10*3/uL (ref 1.500–7.700)
PMN ABS: 1.995 THOU/uL (ref 1.500–7.700)
PMN'S: 54 %
RBC: 5.78 MIL/uL — ABNORMAL HIGH (ref 4.06–5.63)
RDW: 12.8 % (ref 12.0–15.0)
WBC: 3.7 THOU/uL (ref 3.5–11.0)

## 2014-08-17 LAB — BASIC METABOLIC PANEL
ANION GAP: 7 mmol/L (ref 4–13)
BUN/CREAT RATIO: 12 (ref 6–22)
BUN: 13 mg/dL (ref 8–25)
CALCIUM: 9.5 mg/dL (ref 8.5–10.4)
CARBON DIOXIDE: 29 mmol/L (ref 22–32)
CHLORIDE: 106 mmol/L (ref 96–111)
CREATININE: 1.11 mg/dL (ref 0.62–1.27)
ESTIMATED GLOMERULAR FILTRATION RATE: >59 ml/min/1.73m2 (ref >59)
GLUCOSE,NONFAST: 99 mg/dL (ref 65–139)
POTASSIUM: 4 mmol/L (ref 3.5–5.1)
SODIUM: 142 mmol/L (ref 136–145)

## 2014-08-17 LAB — MAGNESIUM: MAGNESIUM: 2.7 mg/dL — ABNORMAL HIGH (ref 1.6–2.5)

## 2014-08-17 LAB — C-REACTIVE PROTEIN(CRP),INFLAMMATION: C-REACTIVE PROTEIN (CRP),INFLAMMATION: <1 mg/L (ref <8.0)

## 2014-08-17 LAB — SEDIMENTATION RATE: SEDIMENTATION RATE: 5 mm/hr (ref 0–15)

## 2014-08-17 MED ORDER — PROCHLORPERAZINE EDISYLATE 10 MG/2 ML (5 MG/ML) INJECTION SOLUTION
10.00 mg | INTRAMUSCULAR | Status: AC
Start: 2014-08-17 — End: 2014-08-17
  Administered 2014-08-17: 10 mg via INTRAVENOUS
  Filled 2014-08-17: qty 2

## 2014-08-17 MED ORDER — IBUPROFEN 400 MG TABLET
400.00 mg | ORAL_TABLET | Freq: Four times a day (QID) | ORAL | Status: AC | PRN
Start: 2014-08-17 — End: ?

## 2014-08-17 MED ORDER — LORAZEPAM 2 MG/ML INJECTION SOLUTION
0.50 mg | INTRAMUSCULAR | Status: DC
Start: 2014-08-17 — End: 2014-08-17

## 2014-08-17 MED ORDER — SUMATRIPTAN 6 MG/0.5 ML SUBCUTANEOUS SOLUTION
6.0000 mg | Freq: Once | SUBCUTANEOUS | Status: DC
Start: 2014-08-17 — End: 2014-08-17
  Administered 2014-08-17: 0 mg via SUBCUTANEOUS
  Filled 2014-08-17: qty 0.5

## 2014-08-17 MED ORDER — DIHYDROERGOTAMINE 1 MG/ML INJECTION SOLUTION
1.00 mg | INTRAMUSCULAR | Status: AC
Start: 2014-08-17 — End: 2014-08-17
  Administered 2014-08-17: 1 mg via INTRAVENOUS
  Administered 2014-08-17: 0 mg via INTRAVENOUS
  Filled 2014-08-17: qty 1

## 2014-08-17 MED ORDER — KETOROLAC 30 MG/ML (1 ML) INJECTION SOLUTION
30.00 mg | INTRAMUSCULAR | Status: AC
Start: 2014-08-17 — End: 2014-08-17
  Filled 2014-08-17: qty 1

## 2014-08-17 MED ORDER — ERGOTAMINE 1 MG-CAFFEINE 100 MG TABLET
1.00 | ORAL_TABLET | ORAL | Status: DC
Start: 2014-08-17 — End: 2014-08-21

## 2014-08-17 MED ORDER — METOCLOPRAMIDE 5 MG/ML INJECTION SOLUTION
10.00 mg | INTRAMUSCULAR | Status: DC
Start: 2014-08-17 — End: 2014-08-17
  Filled 2014-08-17: qty 2

## 2014-08-17 MED ORDER — SODIUM CHLORIDE 0.9 % IV BOLUS
1000.0000 mL | INJECTION | Status: DC
Start: 2014-08-17 — End: 2014-08-17

## 2014-08-17 MED ORDER — DIPHENHYDRAMINE 50 MG/ML INJECTION SOLUTION
12.50 mg | INTRAMUSCULAR | Status: DC
Start: 2014-08-17 — End: 2014-08-17

## 2014-08-17 MED ORDER — DIPHENHYDRAMINE 50 MG/ML INJECTION SOLUTION
12.50 mg | INTRAMUSCULAR | Status: AC
Start: 2014-08-17 — End: 2014-08-17
  Administered 2014-08-17: 12.5 mg via INTRAVENOUS
  Filled 2014-08-17: qty 1

## 2014-08-17 MED ORDER — CAFFEINE 200 MG TABLET
200.00 mg | ORAL_TABLET | ORAL | Status: AC
Start: 2014-08-17 — End: 2014-08-17
  Administered 2014-08-17: 200 mg via ORAL
  Filled 2014-08-17: qty 1

## 2014-08-17 MED ORDER — SODIUM CHLORIDE 0.9 % IV BOLUS
2000.00 mL | INJECTION | Status: AC
Start: 2014-08-17 — End: 2014-08-17
  Administered 2014-08-17: 2000 mL via INTRAVENOUS
  Administered 2014-08-17: 0 mL via INTRAVENOUS

## 2014-08-17 MED ORDER — MAGNESIUM SULFATE 2 GRAM/50 ML (4 %) IN WATER INTRAVENOUS PIGGYBACK
2.0000 g | INJECTION | INTRAVENOUS | Status: DC
Start: 2014-08-17 — End: 2014-08-17

## 2014-08-17 MED ADMIN — sodium chloride 0.9 % (flush) injection syringe: INTRAVENOUS | @ 11:00:00

## 2014-08-17 NOTE — Discharge Instructions (Signed)
Please follow up with Neurology as previously scheduled. Please return to the nearest ER with any new or concerning symptoms.

## 2014-08-17 NOTE — ED Nurses Note (Signed)
Patient discharged home with family.  AVS reviewed with patient/care giver.  A written copy of the AVS and discharge instructions was given to the patient/care giver.  Questions sufficiently answered as needed.  Patient/care giver encouraged to follow up with PCP as indicated.  In the event of an emergency, patient/care giver instructed to call 911 or go to the nearest emergency room. PIV was removed, discharge instructions and prescriptions given to pt.  Pt walked out of ED with family member. Danella PentonEmily J Nasario Czerniak, RN  08/17/2014, 12:43

## 2014-08-17 NOTE — ED Provider Notes (Signed)
Date: 08/17/2014   Attending Physician: Dr. Hyacinth Meeker  Resident Physician: Dr. Marcille Buffy  CC: Headache  HPI:  Hx provided by the patient.  Mike Allen is a 39 y.o. male who presents to the ED via POV complaining of headache. Patient reports having a right-sided headache that is focal behind his eye onset this morning around 07:00. He mentions recent follow up at the ED five days ago where he received extensive follow up. He received CTA and LP with no signs of abnormalities on 08/12/14. Today, he notes the return of this new headache, but his concern was when he looked in the mirror he noticed his right pupil was noticeably larger than the left. He then noticed this on a later occasion, but it has since resolved. His associated symptoms include fatigue, chills and neck stiffness. He reports his neck stiffness has gotten worse since he came here. His headache is exacerbated when standing. Patient notes history of migraines, but states this is not his baseline migraine. He had tried magnesium, reglan and ibuprofen with minimal to no relief. Last dose of Ibuprofen was at 08:00 and Reglan was at 00:00 today. He denies having any gait problems, weakness, numbness or tingling. He mentions having some hypertension in the hospital several days ago, but denies any history of hypertension. No known drug allergies.      Review of Systems:  Constitutional: No fever +Chills +Fatigue  Skin: No rash or diaphoresis  HENT: No congestion +Headache +Neck stiffness  Eyes: No vision changes  Cardio: No chest pain, palpitations or leg swelling  Respiratory: No cough, wheezing or dyspnea  GI:  No nausea, vomiting or stool changes  GU:  No urinary changes  MSK: No joint or back pain  Neuro: No seizures or LOC  All other systems reviewed and are negative or as noted in HPI.      History:  PMH:    Past Medical History   Diagnosis Date    Migraine     Migraines     Hx of shoulder surgery          Previous Medications    DIPHENHYDRAMINE  (BENADRYL) 50 MG ORAL CAPSULE    Take 1 Cap (50 mg total) by mouth Every 6 hours as needed    IBUPROFEN (MOTRIN) 200 MG TAB    take 200 mg by mouth Four times a day as needed.    MAGNESIUM ORAL    take  by mouth.    METOCLOPRAMIDE HCL (REGLAN) 5 MG ORAL TABLET    Take 1 Tab (5 mg total) by mouth Four times a day as needed    ZINC ORAL    take  by mouth.     PSH:  History reviewed. No pertinent past surgical history.      Social Hx:    History     Social History    Marital Status: Married     Spouse Name: N/A    Number of Children: N/A    Years of Education: N/A     Occupational History     Theatre stage manager     Social History Main Topics    Smoking status: Never Smoker     Smokeless tobacco: Not on file    Alcohol Use: Yes      Comment: social    Drug Use: No    Sexual Activity: Not on file     Other Topics Concern    Not on file     Social History  Narrative     Family Hx: No family history on file.  Allergies:   Allergies   Allergen Reactions    Imitrex [Sumatriptan Succinate] Anaphylaxis       Above history reviewed with patient, changes are as documented.      Physical Exam:   Nursing notes reviewed    Filed Vitals:    08/17/14 0952 08/17/14 1230   BP: 160/100 120/79   Pulse: 118 76   Temp: 36.6 C (97.9 F) 36.8 C (98.2 F)   Resp: 20 16   SpO2: 99% 97%         Constitutional: NAD.  HEENT: Normocephalic and atraumatic.  Mouth/Throat: Oropharynx is clear and moist.  Eyes: Conjugate gaze, PERRL, EOMI.  Neck: Trachea midline. Neck supple.  Cardiovascular: RRR, No murmurs, rubs or gallops. Intact distal pulses.  Pulmonary/Chest: BS equal bilaterally. No respiratory distress. No wheezes or rales. No chest tenderness.  Musculoskeletal: No edema, tenderness or deformity.  Skin: Warm and dry. No rash, erythema, pallor or cyanosis.  Psychiatric: Behavior is normal.  Neurological: Alert and oriented x3. Grossly intact.       Impression: Patient presenting complaining of headache.    Plan: Medical Records  reviewed. Ordered the following labs/imaging and administered the following medications to alleviate symptoms:  Orders Placed This Encounter    BASIC METABOLIC PANEL, NON-FASTING    CBC/DIFF    SEDIMENTATION RATE    C-REACTIVE PROTEIN(CRP),INFLAMMATION    CANCELED: MAGNESIUM    BLOOD/SPECIMEN IN LAB    MAGNESIUM    diphenhydrAMINE (BENADRYL) 50 mg/mL injection    ketorolac (TORADOL) 30 mg/mL injection    NS bolus infusion 2,000 mL    caffeine tablet    prochlorperazine (COMPAZINE) 5 mg/mL injection    dihydroergotamine (DHE 45) 1 mg in NS 50 mL IVPB    ergotamine-caffeine (CAFERGOT) 1-100 mg Oral Tablet    Ibuprofen (MOTRIN) 400 mg Oral Tablet       Labs Reviewed   CBC/DIFF - Abnormal; Notable for the following:     RBC 5.78 (*)     HGB 18.0 (*)     HCT 51.0 (*)     MONOS ABS 0.232 (*)     All other components within normal limits   MAGNESIUM - Abnormal; Notable for the following:     MAGNESIUM 2.7 (*)     All other components within normal limits   BASIC METABOLIC PANEL, NON-FASTING   SEDIMENTATION RATE   C-REACTIVE PROTEIN(CRP),INFLAMMATION   BLOOD/SPECIMEN IN LAB     All labs were reviewed.     Course/MDM:  - Patient was given Reglan, Benadryl, Imitrex and Toradol to maintain headache.  - Patient was given caffeine, Compazine and DHE 45 for further treatment. He notes mild improvement.  - Patient notes improvement of headache while in the ED. Will give follow up with Neurology in four days. Discharged with Cafergot for treatment.  Consults: None    - Following the above history, physical exam, and studies, the patient was deemed stable and suitable for discharge. Any and all medication instructions were discussed with the patient/patient's family. Patient was advised to return to the ED with any new, concerning or worsening symptoms and follow up as directed. The patient was given an opportunity to ask questions. The patient verbalized understanding of all instructions and had no further questions  or concerns. Patient was also advised to return to the ED with any new or worsening symptoms.    Disposition: Discharged  Follow Up: [facility]  [address]  [place] [id number]  [phone]    As needed, If symptoms worsen    Clinical Impression:   Encounter Diagnosis   Name Primary?    Migraine with status migrainosus Yes       New Prescriptions    ERGOTAMINE-CAFFEINE (CAFERGOT) 1-100 MG ORAL TABLET    Take 1 Tab by mouth Per instructions    IBUPROFEN (MOTRIN) 400 MG ORAL TABLET    Take 1 Tab (400 mg total) by mouth Four times a day as needed for Pain       Future Appointments scheduled in [facility]:  Future Appointments  Date Time Provider Department Center   08/21/2014 2:00 PM Duru, Shaune SpittleUzoma Bruno, MD NEUR Neurology an         I am scribing for, and in the presence of, Dr. Marcille Buffyragan for services provided on 08/17/2014. [employer]! BrandsLuke Prudich, SCRIBE.      I personally performed the services described in this documentation, as scribed  in my presence, and it is both accurate  and complete.    Donzetta SprungShane R Adamaris King, MD  Donzetta SprungShane R Sylvia Helms, MD  08/19/2014, 09:11

## 2014-08-17 NOTE — ED Nurses Note (Signed)
Patient to ED with complaints of intense headache that began today at 0800.  Patient reports that he was recently treated on 4/26 for headache, but reports today the headache is more intense, focused behind his left eye with occasional stabbing pain.  Patient states he woke up this morning and noticed his pupils were unequal, but that has since resolved.  Patient states he is light sensitive.  Peripheral IV access obtained, labs sent.  Patient medicated per mar,  resting with lights dimmed, will continue to monitor.

## 2014-08-17 NOTE — ED Attending Note (Signed)
Critical Care time billed for this visit is 0 min (exclusive of procedure time)    Note begun by: Staci RighterAndrew Adriahna Shearman, MD on 08/17/2014 at 10:08 AM    History Limitation:  none    Attestation:  I was physically present and directly supervised this patient's care.  Patient seen and examined.  Resident / Trixie DredgeMidlevel / NP history and exam reviewed.   Key elements in addition to and/or correction of that documentation are as follows:    HPI :    39 y.o. y.o. male presents with chief complaint of R HA and dilated R pupil (resolved).  H/o migraines.  Had CTA 4/26 neg, LP 4/27 neg and d/c with referral to neurology.  HA is better lying down.  No neuro deficits.  Increasing neck stiffness.  No fever.        PMHx:  Migraines    PE :   VS on presentation: Blood pressure 160/100, pulse 118, temperature 36.6 C (97.9 F), resp. rate 20, height 1.778 m (5\' 10" ), weight 68.2 kg (150 lb 5.7 oz), SpO2 99 %.  Per midlevel provider or resident note    Data/Test :    Pulse-Ox:  Reviewed; Oxygen therapy needed? no  EKG : none  Images Personally Reviewed : None  Image Reports Reviewed:  CT Brain:  4/26:  Unremarkable noncontrast CTA. Unremarkable CTA head.  Labs:  Laboratory values obtained as documented in the patients chart and the resident note.  Notable lab values: none   Labs Reviewed   CBC/DIFF - Abnormal; Notable for the following:     RBC 5.78 (*)     HGB 18.0 (*)     HCT 51.0 (*)     MONOS ABS 0.232 (*)     All other components within normal limits   MAGNESIUM - Abnormal; Notable for the following:     MAGNESIUM 2.7 (*)     All other components within normal limits   BASIC METABOLIC PANEL, NON-FASTING   SEDIMENTATION RATE   C-REACTIVE PROTEIN(CRP),INFLAMMATION   BLOOD/SPECIMEN IN LAB   MAGNESIUM       Review of Prior Data :       Prior Images : CT Brain:  4/26:  Unremarkable noncontrast CTA. Unremarkable CTA head.  Prior EKG : None  Online Medical Records:  None  Transfer Docs/Images:  None    Initial Assessment:  Intractable  Headache    MDM/Plan:    Labs, IVF, Analgesia, Neuro Consult    ED Course:     12:29 HA resolved after the dihydroergotamine.  Rx caffergott.  F/u in neurology clinic as scheduled on Thursday.      PROCEDURES: none    Disposition: Discharge    Clinical Impression:     Encounter Diagnosis   Name Primary?    Migraine with status migrainosus Yes       CRITICAL CARE : none (exclusive of procedure time)    This note was completed after the conclusion of care given the need for direct patient care at the time of service.

## 2014-08-18 LAB — CSF CULTURE WITH GRAM STAIN
CULTURE OBSERVATION: NO GROWTH
GRAM STAIN: NONE SEEN

## 2014-08-19 ENCOUNTER — Ambulatory Visit
Admission: RE | Admit: 2014-08-19 | Discharge: 2014-08-19 | Disposition: A | Discharge status: Home / Self Care | Payer: BC Managed Care – PPO | Source: Ambulatory Visit | Attending: FAMILY PRACTICE | Admitting: FAMILY PRACTICE

## 2014-08-19 ENCOUNTER — Other Ambulatory Visit (HOSPITAL_COMMUNITY): Payer: Self-pay | Admitting: FAMILY PRACTICE

## 2014-08-19 DIAGNOSIS — R51 Headache: Principal | ICD-10-CM

## 2014-08-19 DIAGNOSIS — R519 Headache, unspecified: Secondary | ICD-10-CM

## 2014-08-19 MED ORDER — GADOBENATE DIMEGLUMINE 529 MG/ML(0.1 MMOL/0.2 ML) INTRAVENOUS SOLUTION
15.00 mL | INTRAVENOUS | Status: AC
Start: 2014-08-19 — End: 2014-08-19
  Administered 2014-08-19: 16:00:00 15 mL via INTRAVENOUS

## 2014-08-21 ENCOUNTER — Ambulatory Visit: Payer: BC Managed Care – PPO | Attending: Neurology | Admitting: Neurology

## 2014-08-21 ENCOUNTER — Encounter (INDEPENDENT_AMBULATORY_CARE_PROVIDER_SITE_OTHER): Payer: Self-pay | Admitting: Neurology

## 2014-08-21 VITALS — BP 138/80 | HR 78 | Ht 70.0 in | Wt 150.6 lb

## 2014-08-21 DIAGNOSIS — G43909 Migraine, unspecified, not intractable, without status migrainosus: Secondary | ICD-10-CM | POA: Insufficient documentation

## 2014-08-21 MED ORDER — ZONISAMIDE 100 MG CAPSULE
100.0000 mg | ORAL_CAPSULE | Freq: Every day | ORAL | Status: DC
Start: 2014-08-21 — End: 2014-08-28

## 2014-08-21 MED ORDER — TIZANIDINE 2 MG TABLET
2.00 mg | ORAL_TABLET | Freq: Three times a day (TID) | ORAL | Status: AC
Start: 2014-08-21 — End: ?

## 2014-08-21 NOTE — H&P (Addendum)
[facility]   [place] Department of Neurology      [facility] by [facility]  Outpatient History and Physical  Date:  08/21/2014  Name: Mike DelKenneth Baillie  MRN: [id number]  Age:  39 y.o.  Referring Physician: Odis LusterPaulson, Debra Jo, MD  [address]  [place], [place] [id number]    Consult: Yes    PCP:  Garth BignessBrian Dixon, MD    CC:    Chief Complaint   Patient presents with    Headache       History Obtained from:  Patient and Family    HPI:  Mike Allen is a 39 year old male with a PMH of Migraines and Right Shoulder surgery (labrum tear) who presents today for evaluation for headaches.  Reports migraine headaches since childhood (age 39 years old).  Former patient of Dr. Claudette LawsWatson.  Has had chronic headache self managed by the patient using OTC NSAID's (ibuprofen), magnesium.   Headaches were controlled, for years occuring 2-3/month.    However on Monday 08/11/14 he had a really severe episode, headaches were occipital, sharp/throbbing, 9/10 in intensity with associated photo/phonophobia, nausea and neck tightness.   He took 800 mg of Ibuprofen, 700 mg of Magnesium and a hot shower which helped ad he went to sleep that night.  The following day he woke feeling better but still endorsed same symptoms prompting a presentation to a [facility] were he got a Toradol shot which was of little benefit.  He saw his chiropractor who sent him to the ED due to an elevated BP 160/104.  In the ED his BP was 210 /98. A non contrast CT was obtained which was negative. He was treated with cocktail with some improvement and discharged.  He symptoms worsened which prompted a representation in the ED. An LP was obtained to rule out SAH. CSF studies were unremarkable. He was again treated with a headache cocktail and discharged.  He represented on 08/17/14 to the ED with same symptoms, and was treated with DHE, which helped.   He was given a prescription for CAFERGOT, Ibuprofen, Reglan and po magnesium.  He recently established care with a new PCP and  was given a script for Nubain for back pain.  An MRI Brain was obtained by PCP which revealed "smooth dural enhancement that can be seen in patients with any symptoms. No nodularity is appreciated. No additional findings suggestive of CSF hypotension are appreciated.This is a doubtful clinical significance." per radiology read.  Past medications tried include Imitrex allergic reaction (throat was closing), Elavil made him feel weird not himself. He was stated on magnesium and has been on it ever since.   Headaches are currently occuring on a daily basis since 08/12/14, but less severe. Headaches are now more frequent.   He denies any focal neurological deficits at this time.  Denies smoking, etoh and recreational drug use  No family history of migraines      Past Medical History:    Past Medical History   Diagnosis Date    Migraine     Migraines     Hx of shoulder surgery        Medications:   Outpatient Prescriptions Marked as Taking for the 08/21/14 encounter (Office Visit) with Duru, Shaune SpittleUzoma Bruno, MD   Medication Sig    diphenhydrAMINE (BENADRYL) 50 mg Oral Capsule Take 1 Cap (50 mg total) by mouth Every 6 hours as needed    Ibuprofen (MOTRIN) 400 mg Oral Tablet Take 1 Tab (400 mg total)  by mouth Four times a day as needed for Pain    MAGNESIUM ORAL take  by mouth.    metoclopramide HCl (REGLAN) 5 mg Oral Tablet Take 1 Tab (5 mg total) by mouth Four times a day as needed    tiZANidine (ZANAFLEX) 2 mg Oral Tablet Take 1 Tab (2 mg total) by mouth Three times a day    zonisamide (ZONEGRAN) 100 mg Oral Capsule Take 1 Cap (100 mg total) by mouth Once a day for 180 days       Allergies:   Allergies   Allergen Reactions    Imitrex [Sumatriptan Succinate] Anaphylaxis    Amitriptyline      Mood changes        Solu-Medrol [Methylprednisolone Sodium Succ]        Family History: No family history on file.        Surgical History: No past surgical history on file.        Social History:    History     Social  History    Marital Status: Married     Spouse Name: N/A    Number of Children: N/A    Years of Education: N/A     Occupational History     Theatre stage manager     Social History Main Topics    Smoking status: Never Smoker     Smokeless tobacco: Not on file    Alcohol Use: Yes      Comment: social    Drug Use: No    Sexual Activity: Not on file     Other Topics Concern    Not on file     Social History Narrative       Review of Systems  Constitutional- No fever  Eyes- No visual change  ENT- Hearing normal  CV- No chest pain  Resp- No Shortness of breath  GI- No diarrhea  GU- Bladder normal  MS- No Arthritis  Skin- No rash  Psych- No depression  Endo- No DM  Heme- No nodes    PHYSICAL EXAM:    BP 138/80 mmHg   Pulse 78   Ht 1.778 m ( )   Wt 68.3 kg (150 lb 9.2 oz)   BMI 21.61 kg/m2    Appearance: No Acute Distress  Ophthalmoscopic: Disc Flat, Normal fundus  Carotid/Heart/Peripheral Vascular: No Bruits, RRR  Orientation: Awake, Alert, and Oriented x 3  Mental status:  Memory: Registation 3/3 Recall 3/3  Attention: Normal  Knowledge: Appropriate  Language: No aphasia  Speech: No dysarthria  Cranial Nerves:  2 No Visual Defect on Confrontation; Pupils round, equal, reactive to light  3,4,6 Extraocular Movements Intact; no nystagmus  5 Facial Sensation Intact  7 No facial asymmetry  8 Intact hearing  9,10 Palate symmetric, normal gag  11 Good shoulder shrug  12 Tongue Midline  Gait: Stable, No ataxia, can perform tandem walking  Coordination: No ataxia with finger to nose testing and heel to shin testing  Sensory: Intact, Symmetric to Pinprick, Light Touch, Vibration, and Joint Position  Muscle Tone: Normal  Muscle exam  Arm Right Left Leg Right Left   Deltoid 5/5 5/5 Iliopsoas 5/5 5/5   Biceps 5/5 5/5 Quads 5/5 5/5   Triceps 5/5 5/5 Hamstrings 5/5 5/5   Wrist Extension 5/5 5/5 Ankle Dorsi Flexion 5/5 5/5   Wrist Flexion 5/5 5/5 Ankle Plantar Flexion 5/5 5/5   Interossei 5/5 5/5 Ankle Eversion 5/5 5/5      APB 5/5 5/5  Ankle Inversion 5/5 5/5       Reflexes   RJ BJ TJ KJ AJ Plantars Hoffman's   Right 2+ 2+ 2+ 2+ 2+ Downgoing Not present   Left 2+ 2+ 2+ 2+ 2+ Downgoing Not present         Diabetes Monitors  A1C - Glucose - Lipids Microalbumin   No results for input(s): HA1C, GLUCOSEFAST, CHOLESTEROL, HDLCHOL, LDLCHOL, LDLCHOLDIR, TRIG in the last [id number] hours. No results for input(s): MICALBRNUR, MICALBCRERAT in the last [id number] hours.     Date of Last Foot Exam  No flowsheet data found.         Personal review of                   Outside records: None    Assessment/Plan:     ICD-10-CM    1. Migraine headache G43.909      Orders Placed This Encounter    zonisamide (ZONEGRAN) 100 mg Oral Capsule    tiZANidine (ZANAFLEX) 2 mg Oral Tablet             Mike Allen is a 39 year old male with a PMH of Migraines and Right Shoulder surgery (labrum tear) who presents today for evaluation for headaches.    Migraine Headache  - changes in weather may have been a trigger per patients reports  -  headaches are occipital, sharp/throbbing, with associated photo/phonophobia, nausea and neck tightness.   - also concern for LP headache given recent LP and MRI findings, however less likely  - MRI Brain w/wo contrast on 08/19/14 reviewed showed very subtle dural enhancement of unclear significance. Finding may suggest low CSF pressure stemming from recent LP with possible CSF leak.  - [facility] and an infectious process. He is afebrile with no signs or symptoms of systemic illness.  - Will start on Zonisamide 100 mg daily for headache prophylaxis + Zanaflex 2 mg TID given neck tightness at thitime  - May consider occipital nerve block in the feature  - will discontinue Cafegort  - continue po magnesium, Reglan  - RTC in 3-4 months        Ninfa LindenUzoma Bruno Duru, MD 08/21/2014, 15:32        Late entry for 08/21/14. I saw and examined the patient.  I reviewed the resident's note.  I agree with the findings and plan of  care as documented in the resident's note.  Any exceptions/additions are edited/noted.    Huel CoteMuhammad Kadia Abaya, MD 08/22/2014, 10:06

## 2014-08-25 ENCOUNTER — Ambulatory Visit (INDEPENDENT_AMBULATORY_CARE_PROVIDER_SITE_OTHER): Payer: Self-pay | Admitting: Neurology

## 2014-08-25 NOTE — Telephone Encounter (Signed)
PLEASE ADVISE.

## 2014-08-25 NOTE — Telephone Encounter (Signed)
-----   Message from Georgina PeerKathy Lemasters sent at 08/25/2014  7:16 AM EDT -----  Regarding: ALLERGIC REACTION/DURU  >> Georgina PeerKATHY LEMASTERS 08/25/2014 07:16 AM  Dr Silas Flooduru, pt had an allergic reaction to the zonisamide (ZONEGRAN) 100 mg Oral Capsule.  Started feeling itchy and broke out in rash from his chest to his head.  Please call to advise.  Thanks

## 2014-08-27 NOTE — Telephone Encounter (Addendum)
08/27/2014  09:37      I spoke with with [name] via telephone, he reported developing a rash after starting the zonisamide consequently he stopped taking the medication. He was frustrated as he still endorsed a daily headache. He is also not taking the the Zanaflex because he reports that it is not helping him. He is still taking po magnesium for prophylaxis. Past medications tried include Imitrex allergic reaction (throat was closing), Elavil made him feel weird not himself. I again reviewed MRI Brain results and CSF studies.   I planned to proceed with a trial of Topamax for ppx therapy, however he is reluctant at this time but will mull things over and would call the clinic when he wants to proceed Topamax or a trial of any other medication.    Mike LindenUzoma Bruno Corean Yoshimura, MD  08/27/2014, 10:28

## 2014-08-28 ENCOUNTER — Other Ambulatory Visit (INDEPENDENT_AMBULATORY_CARE_PROVIDER_SITE_OTHER): Payer: Self-pay | Admitting: Neurology

## 2014-11-11 ENCOUNTER — Ambulatory Visit (INDEPENDENT_AMBULATORY_CARE_PROVIDER_SITE_OTHER): Payer: BC Managed Care – PPO | Admitting: Neurology

## 2014-12-01 ENCOUNTER — Ambulatory Visit (INDEPENDENT_AMBULATORY_CARE_PROVIDER_SITE_OTHER): Payer: Self-pay | Admitting: Neurology

## 2014-12-01 NOTE — Telephone Encounter (Signed)
fyi

## 2014-12-01 NOTE — Telephone Encounter (Signed)
-----   Message from Konrad Felix sent at 12/01/2014 12:57 PM EDT -----  >> Mike Allen 12/01/2014 12:57 PM  Dr. Silas Flood pt  Patient called asking to let Dr. Silas Flood know that he is currently under the care of Dr. Shepard [facility] and is doing Prolo therapy for migraines.

## 2014-12-11 ENCOUNTER — Encounter (INDEPENDENT_AMBULATORY_CARE_PROVIDER_SITE_OTHER): Payer: Self-pay | Admitting: Neurology

## 2015-02-14 ENCOUNTER — Other Ambulatory Visit: Payer: Self-pay

## 2016-02-13 ENCOUNTER — Other Ambulatory Visit: Payer: Self-pay

## 2016-05-24 ENCOUNTER — Ambulatory Visit (INDEPENDENT_AMBULATORY_CARE_PROVIDER_SITE_OTHER): Payer: 59 | Admitting: Allergy and Immunology

## 2016-05-24 ENCOUNTER — Encounter: Payer: Self-pay | Admitting: Allergy and Immunology

## 2016-05-24 VITALS — BP 118/78 | HR 97 | Temp 98.5°F | Resp 16 | Ht 69.0 in | Wt 146.8 lb

## 2016-05-24 DIAGNOSIS — J302 Other seasonal allergic rhinitis: Secondary | ICD-10-CM | POA: Insufficient documentation

## 2016-05-24 DIAGNOSIS — J3089 Other allergic rhinitis: Secondary | ICD-10-CM | POA: Insufficient documentation

## 2016-05-24 DIAGNOSIS — K2970 Gastritis, unspecified, without bleeding: Secondary | ICD-10-CM

## 2016-05-24 DIAGNOSIS — L500 Allergic urticaria: Secondary | ICD-10-CM | POA: Diagnosis not present

## 2016-05-24 LAB — COMPREHENSIVE METABOLIC PANEL
ALT: 30 U/L (ref 9–46)
AST: 25 U/L (ref 10–40)
Albumin: 4.6 g/dL (ref 3.6–5.1)
Alkaline Phosphatase: 47 U/L (ref 40–115)
BUN: 13 mg/dL (ref 7–25)
CO2: 30 mmol/L (ref 20–31)
Calcium: 9.3 mg/dL (ref 8.6–10.3)
Chloride: 104 mmol/L (ref 98–110)
Creat: 1.14 mg/dL (ref 0.60–1.35)
Glucose, Bld: 70 mg/dL (ref 65–99)
Potassium: 3.9 mmol/L (ref 3.5–5.3)
Sodium: 143 mmol/L (ref 135–146)
Total Bilirubin: 0.8 mg/dL (ref 0.2–1.2)
Total Protein: 7 g/dL (ref 6.1–8.1)

## 2016-05-24 LAB — CBC WITH DIFFERENTIAL/PLATELET
Basophils Absolute: 0 cells/uL (ref 0–200)
Basophils Relative: 0 %
Eosinophils Absolute: 47 cells/uL (ref 15–500)
Eosinophils Relative: 1 %
HCT: 47.6 % (ref 38.5–50.0)
Hemoglobin: 16.4 g/dL (ref 13.2–17.1)
Lymphocytes Relative: 41 %
Lymphs Abs: 1927 cells/uL (ref 850–3900)
MCH: 30.9 pg (ref 27.0–33.0)
MCHC: 34.5 g/dL (ref 32.0–36.0)
MCV: 89.8 fL (ref 80.0–100.0)
MPV: 10.6 fL (ref 7.5–12.5)
Monocytes Absolute: 423 cells/uL (ref 200–950)
Monocytes Relative: 9 %
Neutro Abs: 2303 cells/uL (ref 1500–7800)
Neutrophils Relative %: 49 %
Platelets: 151 10*3/uL (ref 140–400)
RBC: 5.3 MIL/uL (ref 4.20–5.80)
RDW: 13.4 % (ref 11.0–15.0)
WBC: 4.7 10*3/uL (ref 3.8–10.8)

## 2016-05-24 MED ORDER — LEVOCETIRIZINE DIHYDROCHLORIDE 5 MG PO TABS: 5.0000 mg | ORAL_TABLET | Freq: Every evening | ORAL | 5 refills | Status: DC

## 2016-05-24 MED ORDER — FLUTICASONE PROPIONATE 50 MCG/ACT NA SUSP: 2.0000 | Freq: Every day | NASAL | 5 refills | Status: DC

## 2016-05-24 NOTE — Patient Instructions (Addendum)
Allergic urticaria Unclear etiology. Skin tests to select food allergens were negative today. NSAIDs and emotional stress commonly exacerbate urticaria but are not the underlying etiology in this case. Physical urticarias are negative by history (i.e. pressure-induced, temperature, vibration, solar, etc.). There are no concomitant symptoms concerning for anaphylaxis or constitutional symptoms worrisome for an underlying malignancy. We will rule out other potential etiologies with labs. For symptom relief, patient is to take oral antihistamines as directed.  The following labs have been ordered: FCeRI antibody, TSH, anti-thyroglobulin antibody, thyroid peroxidase antibody, tryptase, urea breath test, CBC, CMP, ESR, ANA, and galactose-alpha-1,3-galactose IgE level.  The patient will be called with further recommendations after lab results have returned.  Instructions have been discussed and provided for H1/H2 receptor blockade with titration to find lowest effective dose.  A journal is to be kept recording any foods eaten, beverages consumed, medications taken within a 6 hour period prior to the onset of symptoms, as well as record activities being performed, and environmental conditions. For any symptoms concerning for anaphylaxis, 911 is to be called immediately.  Seasonal and perennial allergic rhinitis  Aeroallergen avoidance measures have been discussed and provided in written form.  Levocetirizine has been prescribed (as above).  A prescription has been provided for fluticasone nasal spray, 2 sprays per nostril daily as needed. Proper nasal spray technique has been discussed and demonstrated.  I have also recommended nasal saline spray (i.e., Simply Saline) or nasal saline lavage (i.e., NeilMed) as needed prior to medicated nasal sprays.  If allergen avoidance measures and medications fail to adequately relieve symptoms, aeroallergen immunotherapy will be considered.   When lab results  have returned the patient will be called with further recommendations and follow up instructions.   Reducing Pollen Exposure  The American Academy of Allergy, Asthma and Immunology suggests the following steps to reduce your exposure to pollen during allergy seasons.    1. Do not hang sheets or clothing out to dry; pollen may collect on these items. 2. Do not mow lawns or spend time around freshly cut grass; mowing stirs up pollen. 3. Keep windows closed at night.  Keep car windows closed while driving. 4. Minimize morning activities outdoors, a time when pollen counts are usually at their highest. 5. Stay indoors as much as possible when pollen counts or humidity is high and on windy days when pollen tends to remain in the air longer. 6. Use air conditioning when possible.  Many air conditioners have filters that trap the pollen spores. 7. Use a HEPA room air filter to remove pollen form the indoor air you breathe.   Control of House Dust Mite Allergen  House dust mites play a major role in allergic asthma and rhinitis.  They occur in environments with high humidity wherever human skin, the food for dust mites is found. High levels have been detected in dust obtained from mattresses, pillows, carpets, upholstered furniture, bed covers, clothes and soft toys.  The principal allergen of the house dust mite is found in its feces.  A gram of dust may contain 1,000 mites and 250,000 fecal particles.  Mite antigen is easily measured in the air during house cleaning activities.    1. Encase mattresses, including the box spring, and pillow, in an air tight cover.  Seal the zipper end of the encased mattresses with wide adhesive tape. 2. Wash the bedding in water of 130 degrees Farenheit weekly.  Avoid cotton comforters/quilts and flannel bedding: the most ideal bed covering is the  dacron comforter. 3. Remove all upholstered furniture from the bedroom. 4. Remove carpets, carpet padding, rugs, and  non-washable window drapes from the bedroom.  Wash drapes weekly or use plastic window coverings. 5. Remove all non-washable stuffed toys from the bedroom.  Wash stuffed toys weekly. 6. Have the room cleaned frequently with a vacuum cleaner and a damp dust-mop.  The patient should not be in a room which is being cleaned and should wait 1 hour after cleaning before going into the room. 7. Close and seal all heating outlets in the bedroom.  Otherwise, the room will become filled with dust-laden air.  An electric heater can be used to heat the room. Reduce indoor humidity to less than 50%.  Do not use a humidifier.   Control of Mold Allergen  Mold and fungi can grow on a variety of surfaces provided certain temperature and moisture conditions exist.  Outdoor molds grow on plants, decaying vegetation and soil.  The major outdoor mold, Alternaria and Cladosporium, are found in very high numbers during hot and dry conditions.  Generally, a late Summer - Fall peak is seen for common outdoor fungal spores.  Rain will temporarily lower outdoor mold spore count, but counts rise rapidly when the rainy period ends.  The most important indoor molds are Aspergillus and Penicillium.  Dark, humid and poorly ventilated basements are ideal sites for mold growth.  The next most common sites of mold growth are the bathroom and the kitchen.  Outdoor [employer] 1. Use air conditioning and keep windows closed 2. Avoid exposure to decaying vegetation. 3. Avoid leaf raking. 4. Avoid grain handling. 5. Consider wearing a face mask if working in moldy areas.  Indoor Mold Control 1. Maintain humidity below 50%. 2. Clean washable surfaces with 5% bleach solution. 3. Remove sources e.g. Contaminated carpets.  Control of Cockroach Allergen  Cockroach allergen has been identified as an important cause of acute attacks of asthma, especially in urban settings.  There are fifty-five species of cockroach that exist in the  [place], however only three, the [place], [place] species produce allergen that can affect patients with Asthma.  Allergens can be obtained from fecal particles, egg casings and secretions from cockroaches.    1. Remove food sources. 2. Reduce access to water. 3. Seal access and entry points. 4. Spray runways with 0.5-1% Diazinon or Chlorpyrifos 5. Blow boric acid power under stoves and refrigerator. 6. Place bait stations (hydramethylnon) at feeding sites.

## 2016-05-24 NOTE — Assessment & Plan Note (Signed)
Unclear etiology. Skin tests to select food allergens were negative today. NSAIDs and emotional stress commonly exacerbate urticaria but are not the underlying etiology in this case. Physical urticarias are negative by history (i.e. pressure-induced, temperature, vibration, solar, etc.). There are no concomitant symptoms concerning for anaphylaxis or constitutional symptoms worrisome for an underlying malignancy. We will rule out other potential etiologies with labs. For symptom relief, patient is to take oral antihistamines as directed.  The following labs have been ordered: FCeRI antibody, TSH, anti-thyroglobulin antibody, thyroid peroxidase antibody, tryptase, urea breath test, CBC, CMP, ESR, ANA, and galactose-alpha-1,3-galactose IgE level.  The patient will be called with further recommendations after lab results have returned.  Instructions have been discussed and provided for H1/H2 receptor blockade with titration to find lowest effective dose.  A journal is to be kept recording any foods eaten, beverages consumed, medications taken within a 6 hour period prior to the onset of symptoms, as well as record activities being performed, and environmental conditions. For any symptoms concerning for anaphylaxis, 911 is to be called immediately.

## 2016-05-24 NOTE — Assessment & Plan Note (Signed)
   Aeroallergen avoidance measures have been discussed and provided in written form.  Levocetirizine has been prescribed (as above).  A prescription has been provided for fluticasone nasal spray, 2 sprays per nostril daily as needed. Proper nasal spray technique has been discussed and demonstrated.  I have also recommended nasal saline spray (i.e., Simply Saline) or nasal saline lavage (i.e., NeilMed) as needed prior to medicated nasal sprays.  If allergen avoidance measures and medications fail to adequately relieve symptoms, aeroallergen immunotherapy will be considered.

## 2016-05-24 NOTE — Progress Notes (Signed)
New Patient Note  RE: Stephen Conner MRN: [id number] DOB: 11-Dec-1975 Date of Office Visit: 05/24/2016  Referring provider: Doretha Sou* Primary care provider: Doretha Sou, MD  Chief Complaint: Urticaria and Allergic Rhinitis    History of present illness: Stephen Conner is a 41 y.o. male seen today in consultation requested by Doretha Sou, MD. Over the past 7 years, Stephen Conner has experienced recurrent episodes of hives. Typical distribution includes the chest, neck, and face.  The lesions are described as erythematous, raised, and pruritic.  Individual hives last less than 24 hours without leaving residual pigmentation or bruising. He denies concomitant angioedema, cardiopulmonary symptoms and GI symptoms. He has not experienced unexpected weight loss, recurrent fevers or drenching night sweats. No specific medication, skin care product, detergent, soap, or other environmental triggers have been identified. He is uncertain if consuming vegetables that have been in the refridgerator for several days is a trigger, however he notes that he has has symptoms in the absence of this potential trigger.  The symptoms do not seem to correlate with NSAIDs use or emotional stress.  Stephen Conner has tried to control symptoms with diphenhydramine and occasional systemic steroids which have offered adequate temporary relief of symptoms. He has been evaluated and treated in the urgent care department for these symptoms. Skin biopsy has not been performed.   Stephen Conner experiences nasal congestion, rhinorrhea, sneezing, nasal pruritus, and ocular pruritus.  These symptoms seem to be most pronounced during the springtime and the fall.   Assessment and plan: Allergic urticaria Unclear etiology. Skin tests to select food allergens were negative today. NSAIDs and emotional stress commonly exacerbate urticaria but are not the underlying etiology in this case. Physical urticarias are  negative by history (i.e. pressure-induced, temperature, vibration, solar, etc.). There are no concomitant symptoms concerning for anaphylaxis or constitutional symptoms worrisome for an underlying malignancy. We will rule out other potential etiologies with labs. For symptom relief, patient is to take oral antihistamines as directed.  The following labs have been ordered: FCeRI antibody, TSH, anti-thyroglobulin antibody, thyroid peroxidase antibody, tryptase, urea breath test, CBC, CMP, ESR, ANA, and galactose-alpha-1,3-galactose IgE level.  The patient will be called with further recommendations after lab results have returned.  Instructions have been discussed and provided for H1/H2 receptor blockade with titration to find lowest effective dose.  A journal is to be kept recording any foods eaten, beverages consumed, medications taken within a 6 hour period prior to the onset of symptoms, as well as record activities being performed, and environmental conditions. For any symptoms concerning for anaphylaxis, 911 is to be called immediately.  Seasonal and perennial allergic rhinitis  Aeroallergen avoidance measures have been discussed and provided in written form.  Levocetirizine has been prescribed (as above).  A prescription has been provided for fluticasone nasal spray, 2 sprays per nostril daily as needed. Proper nasal spray technique has been discussed and demonstrated.  I have also recommended nasal saline spray (i.e., Simply Saline) or nasal saline lavage (i.e., NeilMed) as needed prior to medicated nasal sprays.  If allergen avoidance measures and medications fail to adequately relieve symptoms, aeroallergen immunotherapy will be considered.   Meds ordered this encounter  Medications  . levocetirizine (XYZAL) 5 MG tablet    Sig: Take 1 tablet (5 mg total) by mouth every evening.    Dispense:  30 tablet    Refill:  5  . fluticasone (FLONASE) 50 MCG/ACT nasal spray    Sig: Place 2  sprays into both nostrils daily.  Dispense:  16 g    Refill:  5    Diagnostics: Environmental skin testing: Positive to grass pollens, tree pollen, cockroach antigen, and dust mite antigen. Food allergen skin testing: Borderline positive/equivocal to barley.    Physical examination: Blood pressure 118/78, pulse 97, temperature 98.5 F (36.9 C), temperature source Oral, resp. rate 16, height _0  (1.753 m), weight 146 lb 12.8 oz (66.6 kg), SpO2 96 %.  General: Alert, interactive, in no acute distress. HEENT: TMs pearly gray, turbinates mildly edematous without discharge, post-pharynx mildly erythematous. Neck: Supple without lymphadenopathy. Lungs: Clear to auscultation without wheezing, rhonchi or rales. CV: Normal S1, S2 without murmurs. Abdomen: Nondistended, nontender. Skin: Warm and dry, without lesions or rashes. Extremities:  No clubbing, cyanosis or edema. Neuro:   Grossly intact.  Review of systems:  Review of systems negative except as noted in HPI / PMHx or noted below: Review of Systems  Constitutional: Negative.   HENT: Negative.   Eyes: Negative.   Respiratory: Negative.   Cardiovascular: Negative.   Gastrointestinal: Negative.   Genitourinary: Negative.   Musculoskeletal: Negative.   Skin: Negative.   Neurological: Negative.   Endo/Heme/Allergies: Negative.   Psychiatric/Behavioral: Negative.     Past medical history:  Past Medical History:  Diagnosis Date  . Asthma    As a child  . Urticaria     Past surgical history:  Past Surgical History:  Procedure Laterality Date  . SHOULDER SURGERY     Right x 2     Family history: No family history on file.  Social history: Social History   Social History  . Marital status: Married    Spouse name: N/A  . Number of children: N/A  . Years of education: N/A   Occupational History  . Not on file.   Social History Main Topics  . Smoking status: Never Smoker  . Smokeless tobacco: Never Used    . Alcohol use Yes     Comment: occ  . Drug use: No  . Sexual activity: Not on file   Other Topics Concern  . Not on file   Social History Narrative  . No narrative on file   Environmental History: The patient lives in a 41 year old house with carpeting in the bedroom and central air/heat.  There is no known water damage or mildew in the house.  He is a nonsmoker without pets.  Allergies as of 05/24/2016      Reactions   Hydrocodone Itching   Imitrex [sumatriptan] Anaphylaxis   Tramadol Other (See Comments)   twitchy   Zonisamide Rash   Amitriptyline Other (See Comments)   Per pt - no mood      Medication List       Accurate as of 05/24/16  6:07 PM. Always use your most recent med list.          DIGESTIVE ENZYME PO Take by mouth as needed.   fluticasone 50 MCG/ACT nasal spray Commonly known as:  FLONASE Place 2 sprays into both nostrils daily.   ibuprofen 200 MG tablet Commonly known as:  ADVIL,MOTRIN Take 400 mg by mouth every 6 (six) hours as needed for headache or moderate pain.   levocetirizine 5 MG tablet Commonly known as:  XYZAL Take 1 tablet (5 mg total) by mouth every evening.   MAGNESIUM MALATE PO Take 200 mg by mouth 2 (two) times daily as needed.       Known medication allergies: Allergies  Allergen Reactions  . Hydrocodone Itching  .  Imitrex [Sumatriptan] Anaphylaxis  . Tramadol Other (See Comments)    twitchy  . Zonisamide Rash  . Amitriptyline Other (See Comments)    Per pt - no mood    I appreciate the opportunity to take part in [place] care. Please do not hesitate to contact me with questions.  Sincerely,   R. Edgar Frisk, MD

## 2016-05-25 LAB — SEDIMENTATION RATE: Sed Rate: 1 mm/hr (ref 0–15)

## 2016-05-25 LAB — H. PYLORI BREATH TEST: H. pylori Breath Test: NOT DETECTED

## 2016-05-25 LAB — ANA: Anti Nuclear Antibody(ANA): NEGATIVE

## 2016-05-26 LAB — ALPHA-GAL PANEL
Beef IgE: <0.1 kU/L (ref <0.35)
Class: 0
Class: 0
Class: 0
Galactose-alpha-1,3-galactose IgE*: <0.1 kU/L (ref <0.35)
Lamb/Mutton IgE: <0.1 kU/L (ref <0.35)
Pork IgE: <0.1 kU/L (ref <0.35)

## 2016-05-27 LAB — TRYPTASE: Tryptase: 2.9 ug/L (ref <11)

## 2016-06-01 LAB — CP CHRONIC URTICARIA INDEX PANEL
Histamine Release: <16 % (ref <16)
TSH: 0.84 mIU/L (ref 0.40–4.50)
Thyroglobulin Ab: <1 IU/mL (ref <2)
Thyroperoxidase Ab SerPl-aCnc: 1 IU/mL (ref <9)

## 2016-06-16 ENCOUNTER — Telehealth: Payer: Self-pay | Admitting: Allergy and Immunology

## 2016-06-16 MED ORDER — PREDNISONE 10 MG PO TABS: ORAL_TABLET | ORAL | 0 refills | Status: DC

## 2016-06-16 MED ORDER — FEXOFENADINE HCL 180 MG PO TABS: 180.0000 mg | ORAL_TABLET | Freq: Every day | ORAL | 5 refills | Status: DC

## 2016-06-16 NOTE — Telephone Encounter (Signed)
Called patient. Patient stated that Xyzal was making him feel that he could not think straight and very tired. He said for the first week he was taking it in the morning, which made him feel horrible, per patient. For the last two days he has been taking Xyzal at night and still can't think straight, per patient. Patient stated he was going out of town next week, and he's had an outbreak of [place] for the last two days. He wants to see if he could have some prednisone? Or another alternative?    Please advise.

## 2016-06-16 NOTE — Telephone Encounter (Signed)
Please provide him with prednisone, 20 mg x 4 days, 10 mg x1 day, then stop. In addition, he may substitute Allegra (fexofenadine) 180 mg for Xyzal (levocetirizine) 5 mg.

## 2016-06-16 NOTE — Telephone Encounter (Signed)
Pt called and said he was having side effect from the Xyzal , he is very tried and can not think straight. [facility] [phone].

## 2016-06-16 NOTE — Telephone Encounter (Signed)
Called patient and informed him that I will be sending prescriptions in for Prednisone and Fexofenadine to the Pharmacy. I went over instructions with patient.

## 2016-06-22 ENCOUNTER — Telehealth: Payer: Self-pay | Admitting: Allergy

## 2016-06-22 ENCOUNTER — Telehealth: Payer: Self-pay | Admitting: Allergy and Immunology

## 2016-06-22 NOTE — Telephone Encounter (Signed)
I spoke with Gasper LloydLinda Collins about this patient and asked her to call in prescriptions for prednisone, 20 mg x 4 days, 10 mg x1 day, then stop, and epinephrine autoinjectors.

## 2016-06-22 NOTE — Telephone Encounter (Signed)
Called patient states he had swelling, diarrhea, throat closing, chest tightness last night took 50mg  of benadryl and 400mg  of ibuprofen with some relief. Patient stated  it was bad to the point to he wanted to go to hospital he [place] in [place] patient does not have epipen. Dr Nunzio CobbsBobbitt please advise on what to do for patient. If we need to [facility] in [facility] [address] [place], [place] [id number]

## 2016-06-22 NOTE — Telephone Encounter (Signed)
noted 

## 2016-06-22 NOTE — Telephone Encounter (Signed)
Pt called to talk with Dr. Nunzio CobbsBobbitt or nurse about a reaction he had last night. He is heading out of town now so he would like for someone to call as soon as you can. [phone].

## 2016-06-22 NOTE — Telephone Encounter (Signed)
Patient called said he had a reaction last night. Needed his epi-pen and some prednisone called in. Informed Dr. Nunzio CobbsBobbitt and he had me to call in epi-pen 2-2pack and [facility] [place].Phone number is   [phone].Informed patient.

## 2016-07-05 ENCOUNTER — Encounter: Payer: Self-pay | Admitting: Allergy and Immunology

## 2016-07-05 ENCOUNTER — Ambulatory Visit (INDEPENDENT_AMBULATORY_CARE_PROVIDER_SITE_OTHER): Payer: 59 | Admitting: Allergy and Immunology

## 2016-07-05 VITALS — BP 120/68 | HR 82 | Temp 98.1°F | Resp 16 | Ht 69.0 in | Wt 146.0 lb

## 2016-07-05 DIAGNOSIS — L500 Allergic urticaria: Secondary | ICD-10-CM | POA: Diagnosis not present

## 2016-07-05 DIAGNOSIS — J3089 Other allergic rhinitis: Secondary | ICD-10-CM | POA: Diagnosis not present

## 2016-07-05 MED ORDER — PREDNISONE 1 MG PO TABS: 10.0000 mg | ORAL_TABLET | Freq: Every day | ORAL | Status: DC

## 2016-07-05 MED ORDER — MONTELUKAST SODIUM 10 MG PO TABS: 10.0000 mg | ORAL_TABLET | Freq: Every day | ORAL | 5 refills | Status: DC

## 2016-07-05 NOTE — Assessment & Plan Note (Signed)
Currently with suboptimal control.  Instructions have been discussed and provided for H1/H2 receptor blockade with titration to find lowest effective dose.  Due to somnolence with levocetirizine, he will use Allegra (fexofenadine) 180 mg one or 2 times daily.  A prescription has been provided for montelukast 10 mg daily at bedtime.  We will submit appropriate paperwork for insurance approval of Xolair.

## 2016-07-05 NOTE — Progress Notes (Signed)
Follow-up Note  RE: Stephen Conner MRN: [id number] DOB: 12/15/1975 Date of Office Visit: 07/05/2016  Primary care provider: Jonna ClarkKrishnamani Chinnasamy, MD Referring provider: Jonna Clarkhinnasamy, Krishnamani*  History of present illness: Stephen Conner is a 41 y.o. male with allergic rhinitis and recurrent urticaria resents today for follow up.  He is previously seen in this clinic for his initial evaluation on 05/24/2016.  He apparently is still experiencing recurrent pruritus and urticaria.  He believes that the consumption of wine may exacerbate urticaria, however he experiences chronic urticaria in the absence of wine consumption.  He discontinued levocetirizine because of somnolence.  He currently takes Allegra 180 mg daily as needed without adequate symptom relief.  For unclear reasons, he never started ranitidine as previously recommended for H2 receptor blockade.   Assessment and plan: Allergic urticaria Currently with suboptimal control.  Instructions have been discussed and provided for H1/H2 receptor blockade with titration to find lowest effective dose.  Due to somnolence with levocetirizine, he will use Allegra (fexofenadine) 180 mg one or 2 times daily.  A prescription has been provided for montelukast 10 mg daily at bedtime.  We will submit appropriate paperwork for insurance approval of Xolair.  Seasonal and perennial allergic rhinitis  Continue appropriate allergen avoidance measures, fexofenadine 180 mg daily as needed, and fluticasone nasal spray as needed.  If allergen avoidance measures and medications fail to adequately relieve symptoms, aeroallergen immunotherapy will be considered.   Meds ordered this encounter  Medications  . montelukast (SINGULAIR) 10 MG tablet    Sig: Take 1 tablet (10 mg total) by mouth at bedtime.    Dispense:  30 tablet    Refill:  5  . predniSONE (DELTASONE) tablet 10 mg    Physical examination: Blood pressure 120/68, pulse 82,  temperature 98.1 F (36.7 C), temperature source Oral, resp. rate 16, height 5\' 9"  (1.753 m), weight 146 lb (66.2 kg), SpO2 98 %.  General: Alert, interactive, in no acute distress. HEENT: TMs pearly gray, turbinates minimally edematous without discharge, post-pharynx unremarkable. Neck: Supple without lymphadenopathy. Lungs: Clear to auscultation without wheezing, rhonchi or rales. CV: Normal S1, S2 without murmurs. Skin: Warm and dry, without lesions or rashes.  The following portions of the patient's history were reviewed and updated as appropriate: allergies, current medications, past family history, past medical history, past social history, past surgical history and problem list.  Allergies as of 07/05/2016      Reactions   Hydrocodone Itching   Imitrex [sumatriptan] Anaphylaxis   Tramadol Other (See Comments)   twitchy   Zonisamide Rash   Amitriptyline Other (See Comments)   Per pt - no mood      Medication List       Accurate as of 07/05/16  7:27 PM. Always use your most recent med list.          DIGESTIVE ENZYME PO Take by mouth as needed.   EPINEPHrine 0.3 mg/0.3 mL Soaj injection Commonly known as:  EPI-PEN   fexofenadine 180 MG tablet Commonly known as:  ALLEGRA Take 1 tablet (180 mg total) by mouth daily.   fluticasone 50 MCG/ACT nasal spray Commonly known as:  FLONASE Place 2 sprays into both nostrils daily.   ibuprofen 200 MG tablet Commonly known as:  ADVIL,MOTRIN Take 400 mg by mouth every 6 (six) hours as needed for headache or moderate pain.   levocetirizine 5 MG tablet Commonly known as:  XYZAL Take 1 tablet (5 mg total) by mouth every evening.   MAGNESIUM  MALATE PO Take 200 mg by mouth 2 (two) times daily as needed.   montelukast 10 MG tablet Commonly known as:  SINGULAIR Take 1 tablet (10 mg total) by mouth at bedtime.       Allergies  Allergen Reactions  . Hydrocodone Itching  . Imitrex [Sumatriptan] Anaphylaxis  . Tramadol Other  (See Comments)    twitchy  . Zonisamide Rash  . Amitriptyline Other (See Comments)    Per pt - no mood    I appreciate the opportunity to take part in [place] care. Please do not hesitate to contact me with questions.  Sincerely,   R. Jorene Guest, MD

## 2016-07-05 NOTE — Patient Instructions (Addendum)
Allergic urticaria Currently with suboptimal control.  Instructions have been discussed and provided for H1/H2 receptor blockade with titration to find lowest effective dose.  Due to somnolence with levocetirizine, he will use Allegra (fexofenadine) 180 mg one or 2 times daily.  A prescription has been provided for montelukast 10 mg daily at bedtime.  We will submit appropriate paperwork for insurance approval of Xolair.  Seasonal and perennial allergic rhinitis  Continue appropriate allergen avoidance measures, fexofenadine 180 mg daily as needed, and fluticasone nasal spray as needed.  If allergen avoidance measures and medications fail to adequately relieve symptoms, aeroallergen immunotherapy will be considered.   Return in about 6 months (around 01/05/2017), or if symptoms worsen or fail to improve.    Urticaria (Hives)  . Allegra 180 mg twice a day and ranitidine (Zantac) 150 mg twice a day. If no symptoms for 7-14 days then decrease to. . Allegra 180 mg twice a day and ranitidine (Zantac) 150 mg once a day.  If no symptoms for 7-14 days then decrease to. . Allegra 180 mg twice a day.  If no symptoms for 7-14 days then decrease to. . Allegra 180 mg once a day.  Montelukast 10 mg daily  May use Benadryl (diphenhydramine) as needed for breakthrough symptoms       If symptoms return, then step up dosage

## 2016-07-05 NOTE — Assessment & Plan Note (Signed)
   Continue appropriate allergen avoidance measures, fexofenadine 180 mg daily as needed, and fluticasone nasal spray as needed.  If allergen avoidance measures and medications fail to adequately relieve symptoms, aeroallergen immunotherapy will be considered.

## 2016-07-25 ENCOUNTER — Telehealth: Payer: Self-pay | Admitting: *Deleted

## 2016-07-25 NOTE — Telephone Encounter (Signed)
Sounds good. Thanks 

## 2016-07-25 NOTE — Telephone Encounter (Signed)
[facility] called and had contacted patient to set up shipment for Xolair.  Patient advised at this time he wants to put Xolair on hold until he sees how drug changes made at last office visit help his symptoms. I will just wait and see what he plans for now.

## 2016-07-26 ENCOUNTER — Telehealth: Payer: Self-pay | Admitting: Allergy and Immunology

## 2016-07-26 NOTE — Telephone Encounter (Signed)
Told pt he could make monthly pmt on bal of $1405.08 - went to his ded - kt Also wants to bring his son, but he has a $2700 ded so he may not be able to

## 2016-07-26 NOTE — Telephone Encounter (Signed)
Please call patient back about a billing concern.

## 2016-08-11 ENCOUNTER — Telehealth: Payer: Self-pay | Admitting: Allergy and Immunology

## 2016-08-11 MED ORDER — PREDNISONE 10 MG PO TABS: ORAL_TABLET | ORAL | 0 refills | Status: DC

## 2016-08-11 NOTE — Telephone Encounter (Signed)
Yes: Prednisone, 20 mg x 4 days, 10 mg x1 day, then stop.

## 2016-08-11 NOTE — Telephone Encounter (Signed)
Dr Nunzio Cobbs please advise if we can give prednisone

## 2016-08-11 NOTE — Telephone Encounter (Signed)
Prednisone sent to pharmacy. Pt advised.

## 2016-08-11 NOTE — Telephone Encounter (Signed)
Patient was seen 07-05-16, by Dr. Nunzio Cobbs and was given prednisone in case of flare up of hives. He did have a flare up, so he used the prednisone and would like to know if he can have a sample of prednisone or if some can be called in to [facility] pharmacy[facility]. He would like to have some on hand in case of another flare up.

## 2016-08-15 ENCOUNTER — Telehealth: Payer: Self-pay | Admitting: Allergy and Immunology

## 2016-08-15 NOTE — Telephone Encounter (Signed)
We have discussed Xolair with the patient - has he come to a decision on that?

## 2016-08-15 NOTE — Telephone Encounter (Signed)
Left message for patient for him to call back about Xolair decision?

## 2016-08-15 NOTE — Telephone Encounter (Signed)
Pt called and said that he had a flare up over the weekend and would like to talk eith a nurse. [phone]

## 2016-08-15 NOTE — Telephone Encounter (Signed)
Patient states that the thought of doing Xolair makes him very anxious because he is not a big fan of putting extensive amounts of chemicals in his body. He has read quite a bit of information that is concerning him as far as the side effects go.  He does say that he just went and picked up the ranitidine to start the H1/H2 plan with the fexofenadine. He is going to give that a try and give [place] a call back next Monday.

## 2016-08-15 NOTE — Telephone Encounter (Signed)
Noted  

## 2016-08-15 NOTE — Telephone Encounter (Signed)
Called patient. States that he had a head cold 08-12-16 and believes that set his urticaria off. States that he began having an active flare 08-13-16. Prednisone 20 mg x4 days and 10 mg x1 day was sent in 08-11-16 per your instructions. States that he broke out in "rash" on his chest that was very itchy, that spread upward to the top of his head. He also says that he took 40 mg of prednisone one day over the weekend because the "rash" was not going away. He took one and then another when the "rash: started to come back again. I did reiterate that was not how the medication was prescribed. He stated that he did understand that but it seemed to help when he took them like that. He is having a crown placed this afternoon, this is the 3rd one he has had and he does wonder if this has anything to do with it.    Please advise?

## 2016-08-17 ENCOUNTER — Telehealth: Payer: Self-pay | Admitting: Allergy and Immunology

## 2016-08-17 NOTE — Telephone Encounter (Signed)
Patient aware and understood.

## 2016-08-17 NOTE — Telephone Encounter (Signed)
Please advise 

## 2016-08-17 NOTE — Telephone Encounter (Signed)
Patient called and said he was put on ranitidine to try it and was told if he had any side effects, to call the office. He said he is having yellow mucous discharge. Would like to speak to someone about what to do next.

## 2016-08-17 NOTE — Telephone Encounter (Signed)
The thick discharge is not likely related to ranitidine but is much more likely related to the elevated tree pollen and grass pollen, as he is allergic to both of these pollens.  I recommend using fluticasone nasal spray, one spray per nostril twice daily, nasal saline lavage (NeilMed) prior to medicated nasal sprays, and Mucinex 1200 mg twice a day with adequate hydration.

## 2016-08-23 ENCOUNTER — Telehealth: Payer: Self-pay | Admitting: Allergy and Immunology

## 2016-08-23 NOTE — Telephone Encounter (Signed)
Continue H1/H2 and montelukast as he is doing. For thick mucus, uses Mucinex 1200mg  bid and [name] Med nasal saline lavage 2-3 times daily.

## 2016-08-23 NOTE — Telephone Encounter (Signed)
Patient has been informed. States he will start mucinex if necessary.

## 2016-08-23 NOTE — Telephone Encounter (Signed)
Returned patient's call.  He is doing better overall. Still has a small rash on his upper abdomen. He is very itchy, but it is tolerable. He is on his first full week of the H1/H2 step therapy. He is going to continue that into next week to see how he does. Also, he is still having some discolored mucus from his throat, but it is tolerable.  Please advise if any new instructions.

## 2016-08-23 NOTE — Telephone Encounter (Signed)
Patient called to report progress on a course of treatment he is doing. He said he has been working with Dorathy DaftKayla, but could talk to any nurse. He takes Allegra 180 mg 2x a day, Renitodine 150 2x day, Montolukast 10 mg at night. He said he still has a small rash and a little itching, feels like he is on the edge of a breakout, but hasn't happened yet. Would like to speak to someone about this.

## 2016-08-29 ENCOUNTER — Telehealth: Payer: Self-pay

## 2016-08-29 MED ORDER — DOXEPIN HCL 25 MG PO CAPS: 25.0000 mg | ORAL_CAPSULE | Freq: Every day | ORAL | 3 refills | Status: DC

## 2016-08-29 MED ORDER — PREDNISONE 10 MG PO TABS: ORAL_TABLET | ORAL | 0 refills | Status: DC

## 2016-08-29 NOTE — Telephone Encounter (Signed)
Informed patient of Prednisone and Doxepin. [facility]

## 2016-08-29 NOTE — Addendum Note (Signed)
Addended by: Clifton JamesLARK, HEATHER L on: 08/29/2016 04:12 PM   Modules accepted: Orders

## 2016-08-29 NOTE — Telephone Encounter (Signed)
Patient called to let us know that he had another outbreak Saturday. The outbreak went from his chest ,back and to the ears. The patient took a benadryl. Patient stated it took about an hour to start working. Sunday it was better. Patient said he is still itchy and still has a little outbreak. Patient is currently on the highest point of the step up plan. Which is Allegra and Zantac twice a day. He is also taking Montelukast. Patient wants to know what else can he do? He is possibly going to [place] next week.   Please advise  I mailed out his skin test. Patient requested.

## 2016-08-29 NOTE — Telephone Encounter (Signed)
We can give him prednisone for trip to [place] in case he has an outbreak while traveling: 20 mg x 4 days, 10 mg x1 day, then stop.  We can add doxepin 25 mg at bedtime.   We may need to revisit the discussion about Xolair.

## 2016-09-20 ENCOUNTER — Other Ambulatory Visit: Payer: Self-pay | Admitting: Allergy and Immunology

## 2016-11-27 ENCOUNTER — Emergency Department (HOSPITAL_BASED_OUTPATIENT_CLINIC_OR_DEPARTMENT_OTHER)
Admission: EM | Admit: 2016-11-27 | Discharge: 2016-11-28 | Disposition: A | Discharge status: Home / Self Care | Payer: 59 | Attending: Emergency Medicine | Admitting: Emergency Medicine

## 2016-11-27 ENCOUNTER — Encounter (HOSPITAL_BASED_OUTPATIENT_CLINIC_OR_DEPARTMENT_OTHER): Payer: Self-pay | Admitting: Emergency Medicine

## 2016-11-27 ENCOUNTER — Other Ambulatory Visit: Payer: Self-pay | Admitting: Allergy and Immunology

## 2016-11-27 DIAGNOSIS — Y999 Unspecified external cause status: Secondary | ICD-10-CM | POA: Diagnosis not present

## 2016-11-27 DIAGNOSIS — Z79899 Other long term (current) drug therapy: Secondary | ICD-10-CM | POA: Diagnosis not present

## 2016-11-27 DIAGNOSIS — S61411A Laceration without foreign body of right hand, initial encounter: Secondary | ICD-10-CM | POA: Diagnosis not present

## 2016-11-27 DIAGNOSIS — Y929 Unspecified place or not applicable: Secondary | ICD-10-CM | POA: Diagnosis not present

## 2016-11-27 DIAGNOSIS — Z23 Encounter for immunization: Secondary | ICD-10-CM | POA: Insufficient documentation

## 2016-11-27 DIAGNOSIS — J45909 Unspecified asthma, uncomplicated: Secondary | ICD-10-CM | POA: Diagnosis not present

## 2016-11-27 DIAGNOSIS — W260XXA Contact with knife, initial encounter: Secondary | ICD-10-CM | POA: Diagnosis not present

## 2016-11-27 DIAGNOSIS — Y939 Activity, unspecified: Secondary | ICD-10-CM | POA: Insufficient documentation

## 2016-11-27 DIAGNOSIS — S6991XA Unspecified injury of right wrist, hand and finger(s), initial encounter: Secondary | ICD-10-CM | POA: Diagnosis present

## 2016-11-27 MED ORDER — TETANUS-DIPHTH-ACELL PERTUSSIS 5-2.5-18.5 LF-MCG/0.5 IM SUSP
0.5000 mL | Freq: Once | INTRAMUSCULAR | Status: AC
  Administered 2016-11-28: 0.5 mL via INTRAMUSCULAR
  Filled 2016-11-27: qty 0.5

## 2016-11-27 NOTE — ED Triage Notes (Signed)
Patient states that he dropped a knife and tried to catch it. The patient has a laceration to her right hand

## 2016-11-27 NOTE — ED Provider Notes (Signed)
[facility] Provider Note   CSN: [id number] Arrival date & time: 11/27/16  2203  By signing my name below, I, Diona BrownerJennifer Gorman, attest that this documentation has been prepared under the direction and in the presence of Hurman Ketelsen, Mayer Maskerourtney F, MD. Electronically Signed: Diona BrownerJennifer Gorman, ED Scribe. 11/27/16. 11:27 PM.  History   Chief Complaint Chief Complaint  Patient presents with  . Laceration    HPI Stephen Conner is a 41 y.o. male who presents to the Emergency Department complaining of a sudden wound to the palm of his right hand that occurred shortly PTA. Pt rates his pain a 6/10 severity. Pt was helping his wife do the dishes when he accidentally dropped a knife and he instinctively went to catch it. Bleeding controlled. He is right handed. Tetanus is NOT UTD. Pt denies fever, chills, or any other complaints at this time.   The history is provided by the patient. No language interpreter was used.    Past Medical History:  Diagnosis Date  . Asthma    As a child  . Urticaria     Patient Active Problem List   Diagnosis Date Noted  . 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 Seasonal and perennial allergic rhinitis 05/24/2016    Past Surgical History:  Procedure Laterality Date  . SHOULDER SURGERY     Right x 2        Home Medications    Prior to Admission medications   Medication Sig Start Date End Date Taking? Authorizing Provider  Digestive Enzymes (DIGESTIVE ENZYME PO) Take by mouth as needed.    [provider]  doxepin (SINEQUAN) 25 MG capsule Take 1 capsule (25 mg total) by mouth at bedtime. 08/29/16   Bobbitt, Heywood Ilesalph Carter, MD  EPINEPHrine 0.3 mg/0.3 mL IJ SOAJ injection  06/22/16   [provider]  fexofenadine (ALLEGRA) 180 MG tablet TAKE 1 TABLET (180 MG TOTAL) BY MOUTH DAILY. 09/20/16   Kozlow, Alvira PhilipsEric J, MD  fluticasone (FLONASE) 50 MCG/ACT nasal spray Place 2 sprays into both nostrils daily. 05/24/16   Bobbitt, Heywood Ilesalph Carter, MD  ibuprofen  (ADVIL,MOTRIN) 200 MG tablet Take 400 mg by mouth every 6 (six) hours as needed for headache or moderate pain.    [provider]  levocetirizine (XYZAL) 5 MG tablet Take 1 tablet (5 mg total) by mouth every evening. Patient not taking: Reported on 07/05/2016 05/24/16   BobbittHeywood Iles, Ralph Carter, MD  MAGNESIUM MALATE PO Take 200 mg by mouth 2 (two) times daily as needed.    [provider]  montelukast (SINGULAIR) 10 MG tablet Take 1 tablet (10 mg total) by mouth at bedtime. 07/05/16   Bobbitt, Heywood Ilesalph Carter, MD  predniSONE (DELTASONE) 10 MG tablet Take 2 tablets for 4 days, then take 1 tablet for 1 day, then stop. 08/29/16   Bobbitt, Heywood Ilesalph Carter, MD    Family History History reviewed. No pertinent family history.  Social History Social History  Substance Use Topics  . Smoking status: Never Smoker  . Smokeless tobacco: Never Used  . Alcohol use Yes     Comment: occ     Allergies   Hydrocodone; Imitrex [sumatriptan]; Tramadol; Zonisamide; and Amitriptyline   Review of Systems Review of Systems  Constitutional: Negative for chills and fever.  Skin: Positive for wound.  Neurological: Negative for weakness and numbness.  All other systems reviewed and are negative.    Physical Exam Updated Vital Signs BP (!) 146/87 (BP Location: Right Arm)   Pulse 65  Temp 98.1 F (36.7 C) (Oral)   Resp 17   Ht 5\' 10"  (1.778 m)   Wt 68 kg (150 lb)   SpO2 100%   BMI 21.52 kg/m   Physical Exam  Constitutional: He is oriented to person, place, and time. He appears well-developed and well-nourished.  HENT:  Head: Normocephalic and atraumatic.  Cardiovascular: Normal rate, regular rhythm and normal heart sounds.   No murmur heard. Pulmonary/Chest: Effort normal and breath sounds normal. No respiratory distress. He has no wheezes.  Musculoskeletal: He exhibits no edema.  Normal range of motion of the fingers on the right hand at the MCP, PIP, DIP joints, no deformities noted    Neurological: He is alert and oriented to person, place, and time.  Skin: Skin is warm and dry.  Superficial, non-gaping laceration between the webspace things of the second and third digits approximately 1 cm long  Psychiatric: He has a normal mood and affect.  Nursing note and vitals reviewed.    ED Treatments / Results  DIAGNOSTIC STUDIES: Oxygen Saturation is 100% on RA, normal by my interpretation.   COORDINATION OF CARE: 11:27 PM-Discussed next steps with pt. Pt verbalized understanding and is agreeable with the plan.   Labs (all labs ordered are listed, but only abnormal results are displayed) Labs Reviewed - No data to display  EKG  EKG Interpretation None       Radiology No results found.  Procedures .Marland KitchenLaceration Repair Date/Time: 11/28/2016 12:25 AM Performed by: Shon Baton Authorized by: Shon Baton   Consent:    Consent obtained:  Verbal   Consent given by:  Patient Anesthesia (see MAR for exact dosages):    Anesthesia method:  None Laceration details:    Location:  Hand   Hand location:  R palm   Length (cm):  1   Depth (mm):  0.3 Repair type:    Repair type:  Simple Exploration:    Contaminated: no   Treatment:    Area cleansed with:  Hibiclens and saline   Amount of cleaning:  Standard   Irrigation solution:  Sterile saline   Irrigation method:  Syringe   Visualized foreign bodies/material removed: no   Skin repair:    Repair method:  Tissue adhesive Approximation:    Approximation:  Close   Vermilion border: well-aligned   Post-procedure details:    Dressing:  Non-adherent dressing   Patient tolerance of procedure:  Tolerated well, no immediate complications   (including critical care time)  Medications Ordered in ED Medications  Tdap (BOOSTRIX) injection 0.5 mL (0.5 mLs Intramuscular Given 11/28/16 0016)     Initial Impression / Assessment and Plan / ED Course  I have reviewed the triage vital signs and the  nursing notes.  Pertinent labs & imaging results that were available during my care of the patient were reviewed by me and considered in my medical decision making (see chart for details).     Patient presents with a superficial laceration to the right hand.  This was updated. It is non-gaping except for with extension of the fingers. It mildly gapes when fingers are extended. It is very superficial. Would likely heal well on its own. However, have offered Dermabond to aid in initial wound healing for the next 24-48 hours. Suspect this will fall off prematurely given that it is the hand. Wound care instructions given. Avoid submersion of the hand for the next week.  After history, exam, and medical workup I feel the patient  has been appropriately medically screened and is safe for discharge home. Pertinent diagnoses were discussed with the patient. Patient was given return precautions.   Final Clinical Impressions(s) / ED Diagnoses   Final diagnoses:  Laceration of right hand without foreign body, initial encounter    New Prescriptions New Prescriptions   No medications on file   I personally performed the services described in this documentation, which was scribed in my presence. The recorded information has been reviewed and is accurate.     Shon Baton, MD 11/28/16 [id number]

## 2016-11-28 NOTE — Discharge Instructions (Signed)
Avoid submersion of your hand for the next week.  Allow Dermabond to fall naturally. Bacitracin as needed for local wound care. Ibuprofen for any pain.

## 2018-07-23 ENCOUNTER — Encounter (HOSPITAL_BASED_OUTPATIENT_CLINIC_OR_DEPARTMENT_OTHER): Payer: Self-pay | Admitting: Emergency Medicine

## 2018-07-23 ENCOUNTER — Other Ambulatory Visit: Payer: Self-pay

## 2018-07-23 ENCOUNTER — Emergency Department (HOSPITAL_BASED_OUTPATIENT_CLINIC_OR_DEPARTMENT_OTHER): Payer: Managed Care, Other (non HMO)

## 2018-07-23 ENCOUNTER — Emergency Department (HOSPITAL_BASED_OUTPATIENT_CLINIC_OR_DEPARTMENT_OTHER)
Admission: EM | Admit: 2018-07-23 | Discharge: 2018-07-23 | Disposition: A | Discharge status: Home / Self Care | Payer: Managed Care, Other (non HMO) | Attending: Emergency Medicine | Admitting: Emergency Medicine

## 2018-07-23 DIAGNOSIS — R05 Cough: Secondary | ICD-10-CM | POA: Insufficient documentation

## 2018-07-23 DIAGNOSIS — R059 Cough, unspecified: Secondary | ICD-10-CM

## 2018-07-23 DIAGNOSIS — J45909 Unspecified asthma, uncomplicated: Secondary | ICD-10-CM | POA: Insufficient documentation

## 2018-07-23 DIAGNOSIS — Z79899 Other long term (current) drug therapy: Secondary | ICD-10-CM | POA: Diagnosis not present

## 2018-07-23 NOTE — ED Provider Notes (Signed)
[facility] EMERGENCY DEPARTMENT Provider Note   CSN: [id number] Arrival date & time: 07/23/18  1513    History   Chief Complaint Chief [id number]  Patient presents with   Shortness of Breath    HPI Stephen Conner is a 43 y.o. male.     The history is provided by the patient.  Cough  Cough characteristics:  Non-productive Sputum characteristics:  Nondescript Severity:  Moderate Onset quality:  Gradual Duration:  3 weeks Timing:  Intermittent Progression:  Waxing and waning Chronicity:  New Smoker: no   Context: upper respiratory infection   Relieved by:  Nothing Worsened by:  Nothing Associated symptoms: shortness of breath   Associated symptoms: no chest pain, no chills, no diaphoresis, no ear pain, no fever, no rash, no sinus congestion, no sore throat and no weight loss   Risk factors: recent infection     Past Medical History:  Diagnosis Date   Asthma    As a child   Urticaria     Patient Active Problem List   Diagnosis Date Noted   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 Authorizing Provider  Digestive Enzymes (DIGESTIVE ENZYME PO) Take by mouth as needed.    [provider]  doxepin (SINEQUAN) 25 MG capsule Take 1 capsule (25 mg total) by mouth at bedtime. 08/29/16   Bobbitt, Heywood Ilesalph Carter, MD  EPINEPHrine 0.3 mg/0.3 mL IJ SOAJ injection  06/22/16   [provider]  fexofenadine (ALLEGRA) 180 MG tablet TAKE 1 TABLET BY MOUTH EVERY DAY 11/28/16   Kozlow, Alvira PhilipsEric J, MD  fluticasone (FLONASE) 50 MCG/ACT nasal spray Place 2 sprays into both nostrils daily. 05/24/16   Bobbitt, Heywood Ilesalph Carter, MD  ibuprofen (ADVIL,MOTRIN) 200 MG tablet Take 400 mg by mouth every 6 (six) hours as needed for  headache or moderate pain.    [provider]  levocetirizine (XYZAL) 5 MG tablet Take 1 tablet (5 mg total) by mouth every evening. Patient not taking: Reported on 07/05/2016 05/24/16   BobbittHeywood Iles, Ralph Carter, MD  MAGNESIUM MALATE PO Take 200 mg by mouth 2 (two) times daily as needed.    [provider]  montelukast (SINGULAIR) 10 MG tablet Take 1 tablet (10 mg total) by mouth at bedtime. 07/05/16   Bobbitt, Heywood Ilesalph Carter, MD  predniSONE (DELTASONE) 10 MG tablet Take 2 tablets for 4 days, then take 1 tablet for 1 day, then stop. 08/29/16   Bobbitt, Heywood Ilesalph Carter, MD    Family History History reviewed. No pertinent family history.  Social History Social History   Tobacco Use   Smoking status: Never Smoker   Smokeless tobacco: Never Used  Substance Use Topics   Alcohol use: Yes    Comment: occ   Drug use: No     Allergies   Hydrocodone; Imitrex [sumatriptan]; Tramadol; Zonisamide; and Amitriptyline   Review of Systems Review of Systems  Constitutional: Negative for chills, diaphoresis, fever and weight loss.  HENT: Negative for ear pain and sore throat.   Eyes: Negative for pain and visual disturbance.  Respiratory: Positive for cough and shortness of breath.   Cardiovascular: Negative for chest pain and palpitations.  Gastrointestinal: Negative for abdominal pain and vomiting.  Genitourinary: Negative for dysuria and hematuria.  Musculoskeletal: Negative for arthralgias and back pain.  Skin: Negative for color change and rash.  Neurological: Negative for seizures and syncope.  All other systems reviewed and are negative.    Physical Exam Updated Vital Signs  ED Triage Vitals [07/23/18 1520]  Enc Vitals Group     BP (!) 178/82     Pulse Rate 93     Resp 20     Temp 98.8 F (37.1 C)     Temp Source Oral     SpO2 100 %     Weight 145 lb (65.8 kg)     Height      Head Circumference      Peak Flow      Pain Score 0     Pain Loc      Pain Edu?       Excl. in GC?     Physical Exam Vitals signs and nursing note reviewed.  Constitutional:      Appearance: He is well-developed.  HENT:     Head: Normocephalic and atraumatic.  Eyes:     Extraocular Movements: Extraocular movements intact.     Conjunctiva/sclera: Conjunctivae normal.     Pupils: Pupils are equal, round, and reactive to light.  Neck:     Musculoskeletal: Neck supple.  Cardiovascular:     Rate and Rhythm: Normal rate and regular rhythm.     Pulses: Normal pulses.     Heart sounds: Normal heart sounds. No murmur.  Pulmonary:     Effort: Pulmonary effort is normal. No tachypnea or respiratory distress.  Abdominal:     Palpations: Abdomen is soft.     Tenderness: There is no abdominal tenderness.  Musculoskeletal:     Right lower leg: No edema.     Left lower leg: No edema.  Skin:    General: Skin is warm and dry.     Capillary Refill: Capillary refill takes less than 2 seconds.  Neurological:     General: No focal deficit present.     Mental Status: He is alert.  Psychiatric:        Mood and Affect: Mood normal.      ED Treatments / Results  Labs (all labs ordered are listed, but only abnormal results are displayed) Labs Reviewed - No data to display  EKG EKG Interpretation  Date/Time:  Monday July 23 2018 15:24:55 EDT Ventricular Rate:  92 PR Interval:    QRS Duration: 90 QT Interval:  340 QTC Calculation: 421 R Axis:   81 Text Interpretation:  Sinus rhythm Right atrial enlargement Confirmed by Virgina Norfolk [id number] on 07/23/2018 3:32:21 PM   Radiology Dg Chest Portable 1 View  Result Date: 07/23/2018 CLINICAL DATA:  Shortness of breath for 3 weeks EXAM: PORTABLE CHEST 1 VIEW COMPARISON:  None. FINDINGS: Lungs are clear. Heart size and pulmonary vascularity are normal. No adenopathy. No bone lesions. IMPRESSION: No abnormality noted. Electronically Signed   By: Bretta Bang III M.D.   On: 07/23/2018 16:12    Procedures Procedures  (including critical care time)  Medications Ordered in ED Medications - No data to display   Initial Impression / Assessment and Plan / ED Course  I have reviewed the triage vital signs and the nursing notes.  Pertinent labs & imaging results that were available during my care of the patient were reviewed by me and considered in my medical decision making (see chart for details).  Stephen Conner is a 31 year old with a history of chronic ED uticaria who shows up to the ED with cough.  Patient with normal vitals.  No fever.  Patient has had symptoms for the last several weeks.  Has had some intermittent shortness of breath.  Patient with no signs of respiratory distress.  No fever.  Possibly mild URI.  Had history of asthma as a child but has not used inhalers in several years.  Patient had chest x-ray that showed no signs of pneumonia, pneumothorax, pleural effusion.  Patient with no DVT or PE risk factors.  PERC negative and doubt PE.  Does not have any chest pain.  EKG shows sinus rhythm.  No ischemic changes.  Doubt ACS.  Overall suspect likely resolving URI.  Possibly coronavirus and educated about that.  Has been self isolating for the last several weeks and has been working from home.  Given further instructions about coronavirus.  Likely has post viral cough.  Given return precautions and discharged from ED in good condition.  This chart was dictated using voice recognition software.  Despite best efforts to proofread,  errors can occur which can change the documentation meaning.    Final Clinical Impressions(s) / ED Diagnoses   Final diagnoses:  Cough    ED Discharge Orders    None       Virgina Norfolk, DO 07/23/18 1625

## 2018-07-23 NOTE — ED Notes (Signed)
Patient denies pain and is resting comfortably.  

## 2018-07-23 NOTE — Discharge Instructions (Addendum)
Continue home isolation as you have been doing.  I believe your symptoms are likely close to being resolved.  He may continue to have a cough but continued supportive care with good hydration and Tylenol if needed. If you live with, or provide care at home for, a person confirmed to have, or being evaluated for, COVID-19 infection please follow these guidelines to prevent infection:  Follow healthcare providers instructions Make sure that you understand and can help the patient follow any healthcare provider instructions for all care.  Provide for the patients basic needs You should help the patient with basic needs in the home and provide support for getting groceries, prescriptions, and other personal needs.  Monitor the patients symptoms If they are getting sicker, call his or her medical provider a  This will help the healthcare providers office take steps to keep other people from getting infected. Ask the healthcare provider to call the local or state health department.  Limit the number of people who have contact with the patient If possible, have only one caregiver for the patient. Other household members should stay in another home or place of residence. If this is not possible, they should stay in another room, or be separated from the patient as much as possible. Use a separate bathroom, if available. Restrict visitors who do not have an essential need to be in the home.  Keep older adults, very young children, and other sick people away from the patient Keep older adults, very young children, and those who have compromised immune systems or chronic health conditions away from the patient. This includes people with chronic heart, lung, or kidney conditions, diabetes, and cancer.  Ensure good ventilation Make sure that shared spaces in the home have good air flow, such as from an air conditioner or an opened window, weather permitting.  Wash your hands often Wash your  hands often and thoroughly with soap and water for at least 20 seconds. You can use an alcohol based hand sanitizer if soap and water are not available and if your hands are not visibly dirty. Avoid touching your eyes, nose, and mouth with unwashed hands. Use disposable paper towels to dry your hands. If not available, use dedicated cloth towels and replace them when they become wet.  Wear a facemask and gloves Wear a disposable facemask at all times in the room and gloves when you touch or have contact with the patients blood, body fluids, and/or secretions or excretions, such as sweat, saliva, sputum, nasal mucus, vomit, urine, or feces.  Ensure the mask fits over your nose and mouth tightly, and do not touch it during use. Throw out disposable facemasks and gloves after using them. Do not reuse. Wash your hands immediately after removing your facemask and gloves. If your personal clothing becomes contaminated, carefully remove clothing and launder. Wash your hands after handling contaminated clothing. Place all used disposable facemasks, gloves, and other waste in a lined container before disposing them with other household waste. Remove gloves and wash your hands immediately after handling these items.  Do not share dishes, glasses, or other household items with the patient Avoid sharing household items. You should not share dishes, drinking glasses, cups, eating utensils, towels, bedding, or other items After the person uses these items, you should wash them thoroughly with soap and water.  Wash laundry thoroughly Immediately remove and wash clothes or bedding that have blood, body fluids, and/or secretions or excretions, such as sweat, saliva, sputum, nasal mucus, vomit,  urine, or feces, on them. Wear gloves when handling laundry from the patient. Read and follow directions on labels of laundry or clothing items and detergent. In general, wash and dry with the warmest temperatures  recommended on the label.  Clean all areas the individual has used often Clean all touchable surfaces, such as counters, tabletops, doorknobs, bathroom fixtures, toilets, phones, keyboards, tablets, and bedside tables, every day. Also, clean any surfaces that may have blood, body fluids, and/or secretions or excretions on them. Wear gloves when cleaning surfaces the patient has come in contact with. Use a diluted bleach solution (e.g., dilute bleach with 1 part bleach and 10 parts water) or a household disinfectant with a label that says EPA-registered for coronaviruses. To make a bleach solution at home, add 1 tablespoon of bleach to 1 quart (4 cups) of water. For a larger supply, add  cup of bleach to 1 gallon (16 cups) of water. Read labels of cleaning products and follow recommendations provided on product labels. Labels contain instructions for safe and effective use of the cleaning product including precautions you should take when applying the product, such as wearing gloves or eye protection and making sure you have good ventilation during use of the product. Remove gloves and wash hands immediately after cleaning.  Monitor yourself for signs and symptoms of illness Caregivers and household members are considered close contacts, should monitor their health, and will be asked to limit movement outside of the home to the extent possible. Follow the monitoring steps for close contacts listed on the symptom monitoring form.   ? If you have additional questions, contact your local health department or call the epidemiologist on call at [phone] (available 24/7). ? This guidance is subject to change. For the most up-to-date guidance from [facility], please refer to their website: [employer]

## 2018-07-23 NOTE — ED Notes (Signed)
Pt. Is in no distress of any kind noted

## 2018-07-23 NOTE — ED Triage Notes (Signed)
Reports shortness of breath x 3 weeks.  States this is not worsening but feels like he cannot fully catch his breath.  Had chest xray on 3/22 without definitive diagnosis.  Denies confirmed contact with covid-19. History of asthma as a child.

## 2019-11-01 ENCOUNTER — Inpatient Hospital Stay (HOSPITAL_BASED_OUTPATIENT_CLINIC_OR_DEPARTMENT_OTHER)
Admission: EM | Admit: 2019-11-01 | Discharge: 2019-11-05 | DRG: 558 | Disposition: A | Discharge status: Home / Self Care | Payer: Managed Care, Other (non HMO) | Attending: Internal Medicine | Admitting: Internal Medicine

## 2019-11-01 ENCOUNTER — Encounter (HOSPITAL_BASED_OUTPATIENT_CLINIC_OR_DEPARTMENT_OTHER): Payer: Self-pay | Admitting: Emergency Medicine

## 2019-11-01 ENCOUNTER — Other Ambulatory Visit: Payer: Self-pay

## 2019-11-01 DIAGNOSIS — Z820 Family history of epilepsy and other diseases of the nervous system: Secondary | ICD-10-CM

## 2019-11-01 DIAGNOSIS — R21 Rash and other nonspecific skin eruption: Secondary | ICD-10-CM | POA: Diagnosis present

## 2019-11-01 DIAGNOSIS — Z885 Allergy status to narcotic agent status: Secondary | ICD-10-CM

## 2019-11-01 DIAGNOSIS — R821 Myoglobinuria: Secondary | ICD-10-CM

## 2019-11-01 DIAGNOSIS — Z79899 Other long term (current) drug therapy: Secondary | ICD-10-CM

## 2019-11-01 DIAGNOSIS — L501 Idiopathic urticaria: Secondary | ICD-10-CM | POA: Diagnosis present

## 2019-11-01 DIAGNOSIS — M6282 Rhabdomyolysis: Principal | ICD-10-CM | POA: Diagnosis present

## 2019-11-01 DIAGNOSIS — E291 Testicular hypofunction: Secondary | ICD-10-CM | POA: Diagnosis present

## 2019-11-01 DIAGNOSIS — E876 Hypokalemia: Secondary | ICD-10-CM | POA: Diagnosis present

## 2019-11-01 DIAGNOSIS — R0602 Shortness of breath: Secondary | ICD-10-CM

## 2019-11-01 DIAGNOSIS — T796XXA Traumatic ischemia of muscle, initial encounter: Secondary | ICD-10-CM

## 2019-11-01 DIAGNOSIS — Z20822 Contact with and (suspected) exposure to covid-19: Secondary | ICD-10-CM | POA: Diagnosis present

## 2019-11-01 DIAGNOSIS — G43909 Migraine, unspecified, not intractable, without status migrainosus: Secondary | ICD-10-CM | POA: Diagnosis present

## 2019-11-01 DIAGNOSIS — J3089 Other allergic rhinitis: Secondary | ICD-10-CM | POA: Diagnosis present

## 2019-11-01 DIAGNOSIS — E349 Endocrine disorder, unspecified: Secondary | ICD-10-CM | POA: Diagnosis present

## 2019-11-01 DIAGNOSIS — Z888 Allergy status to other drugs, medicaments and biological substances status: Secondary | ICD-10-CM

## 2019-11-01 DIAGNOSIS — R8290 Unspecified abnormal findings in urine: Secondary | ICD-10-CM | POA: Diagnosis not present

## 2019-11-01 DIAGNOSIS — R7989 Other specified abnormal findings of blood chemistry: Secondary | ICD-10-CM

## 2019-11-01 DIAGNOSIS — Z8249 Family history of ischemic heart disease and other diseases of the circulatory system: Secondary | ICD-10-CM

## 2019-11-01 LAB — CBC WITH DIFFERENTIAL/PLATELET
Abs Immature Granulocytes: 0.01 10*3/uL (ref 0.00–0.07)
Basophils Absolute: 0 10*3/uL (ref 0.0–0.1)
Basophils Relative: 0 %
Eosinophils Absolute: 0.1 10*3/uL (ref 0.0–0.5)
Eosinophils Relative: 1 %
HCT: 46.5 % (ref 39.0–52.0)
Hemoglobin: 15.9 g/dL (ref 13.0–17.0)
Immature Granulocytes: 0 %
Lymphocytes Relative: 38 %
Lymphs Abs: 3.5 10*3/uL (ref 0.7–4.0)
MCH: 30.3 pg (ref 26.0–34.0)
MCHC: 34.2 g/dL (ref 30.0–36.0)
MCV: 88.6 fL (ref 80.0–100.0)
Monocytes Absolute: 0.8 10*3/uL (ref 0.1–1.0)
Monocytes Relative: 9 %
Neutro Abs: 4.7 10*3/uL (ref 1.7–7.7)
Neutrophils Relative %: 52 %
Platelets: 197 10*3/uL (ref 150–400)
RBC: 5.25 MIL/uL (ref 4.22–5.81)
RDW: 12.1 % (ref 11.5–15.5)
WBC: 9.2 10*3/uL (ref 4.0–10.5)
nRBC: 0 % (ref 0.0–0.2)

## 2019-11-01 LAB — URINALYSIS, ROUTINE W REFLEX MICROSCOPIC
Bilirubin Urine: NEGATIVE
Glucose, UA: NEGATIVE mg/dL
Ketones, ur: NEGATIVE mg/dL
Leukocytes,Ua: NEGATIVE
Nitrite: NEGATIVE
Protein, ur: 30 mg/dL — AB
Specific Gravity, Urine: 1.01 (ref 1.005–1.030)
pH: 6.5 (ref 5.0–8.0)

## 2019-11-01 LAB — BASIC METABOLIC PANEL
Anion gap: 10 (ref 5–15)
BUN: 13 mg/dL (ref 6–20)
CO2: 27 mmol/L (ref 22–32)
Calcium: 8.6 mg/dL — ABNORMAL LOW (ref 8.9–10.3)
Chloride: 99 mmol/L (ref 98–111)
Creatinine, Ser: 1.24 mg/dL (ref 0.61–1.24)
GFR calc Af Amer: >60 mL/min (ref >60)
GFR calc non Af Amer: >60 mL/min (ref >60)
Glucose, Bld: 101 mg/dL — ABNORMAL HIGH (ref 70–99)
Potassium: 3 mmol/L — ABNORMAL LOW (ref 3.5–5.1)
Sodium: 136 mmol/L (ref 135–145)

## 2019-11-01 LAB — URINALYSIS, MICROSCOPIC (REFLEX)
Bacteria, UA: NONE SEEN
Squamous Epithelial / HPF: NONE SEEN (ref 0–5)

## 2019-11-01 LAB — CK
Total CK: >50000 U/L — ABNORMAL HIGH (ref 49–397)
Total CK: 15630 U/L — ABNORMAL HIGH (ref 49–397)

## 2019-11-01 LAB — SARS CORONAVIRUS 2 BY RT PCR (HOSPITAL ORDER, PERFORMED IN ~~LOC~~ HOSPITAL LAB): SARS Coronavirus 2: NEGATIVE

## 2019-11-01 MED ORDER — SODIUM BICARBONATE-DEXTROSE 150-5 MEQ/L-% IV SOLN
150.0000 meq | INTRAVENOUS | Status: DC
  Filled 2019-11-01: qty 1000

## 2019-11-01 MED ORDER — ACETAMINOPHEN 325 MG PO TABS
650.0000 mg | ORAL_TABLET | Freq: Four times a day (QID) | ORAL | Status: DC | PRN
  Administered 2019-11-02: 650 mg via ORAL
  Filled 2019-11-01 (×2): qty 2

## 2019-11-01 MED ORDER — SENNOSIDES-DOCUSATE SODIUM 8.6-50 MG PO TABS: 1.0000 | ORAL_TABLET | Freq: Every evening | ORAL | Status: DC | PRN

## 2019-11-01 MED ORDER — ACETAMINOPHEN 650 MG RE SUPP: 650.0000 mg | Freq: Four times a day (QID) | RECTAL | Status: DC | PRN

## 2019-11-01 MED ORDER — SODIUM CHLORIDE 0.9 % IV BOLUS
1000.0000 mL | Freq: Once | INTRAVENOUS | Status: AC
  Administered 2019-11-01: 1000 mL via INTRAVENOUS

## 2019-11-01 MED ORDER — SODIUM CHLORIDE 0.9 % IV SOLN: INTRAVENOUS | Status: DC

## 2019-11-01 MED ORDER — ENOXAPARIN SODIUM 40 MG/0.4ML ~~LOC~~ SOLN
40.0000 mg | SUBCUTANEOUS | Status: DC
  Administered 2019-11-01 – 2019-11-04 (×4): 40 mg via SUBCUTANEOUS
  Filled 2019-11-01 (×5): qty 0.4

## 2019-11-01 MED ORDER — ONDANSETRON HCL 4 MG PO TABS: 4.0000 mg | ORAL_TABLET | Freq: Four times a day (QID) | ORAL | Status: DC | PRN

## 2019-11-01 MED ORDER — ONDANSETRON HCL 4 MG/2ML IJ SOLN: 4.0000 mg | Freq: Four times a day (QID) | INTRAMUSCULAR | Status: DC | PRN

## 2019-11-01 NOTE — Plan of Care (Signed)

## 2019-11-01 NOTE — ED Provider Notes (Signed)
[facility] Provider Note: Stephen Dell, MD, FACEP  CSN: [id number] MRN: [id number] ARRIVAL: 11/01/19 at 0401 ROOM: [facility]05/[facility]05   CHIEF COMPLAINT  Dark Urine   HISTORY OF PRESENT ILLNESS  11/01/19 4:22 AM Stephen Conner is a 44 y.o. male who started a new CrossFit training class two days ago. He he thinks he may have overexerted himself. He has been having pain in his muscles, notably his quadriceps, which was worse yesterday. He currently rates the pain in his thighs as a 2 out of 10, worse with palpation or movement.  He is here with progressively darkening urine since yesterday. The onset has been gradual. He describes his urine is almost Coca-Cola colored and cloudy. He has no dysuria associated with this. He denies chest pain, shortness of breath, flank pain or abdominal pain.   Past Medical History:  Diagnosis Date  . Asthma    As a child  . Urticaria     Past Surgical History:  Procedure Laterality Date  . SHOULDER SURGERY     Right x 2     Family History  Problem Relation Age of Onset  . Fibromyalgia Mother   . Hypertension Mother   . Multiple sclerosis Brother     Social History   Tobacco Use  . Smoking status: Never Smoker  . Smokeless tobacco: Never Used  Vaping Use  . Vaping Use: Never used  Substance Use Topics  . Alcohol use: Yes    Comment: occ  . Drug use: No    Prior to Admission medications   Medication Sig Start Date End Date Taking? Authorizing Provider  Digestive Enzymes (DIGESTIVE ENZYME PO) Take by mouth as needed.    [provider]  doxepin (SINEQUAN) 25 MG capsule Take 1 capsule (25 mg total) by mouth at bedtime. 08/29/16   Bobbitt, Heywood Iles, MD  EPINEPHrine 0.3 mg/0.3 mL IJ SOAJ injection  06/22/16   [provider]  fexofenadine (ALLEGRA) 180 MG tablet TAKE 1 TABLET BY MOUTH EVERY DAY 11/28/16   Kozlow, Alvira Philips, MD  fluticasone (FLONASE) 50 MCG/ACT nasal spray Place 2 sprays into both nostrils daily.  05/24/16   Bobbitt, Heywood Iles, MD  ibuprofen (ADVIL,MOTRIN) 200 MG tablet Take 400 mg by mouth every 6 (six) hours as needed for headache or moderate pain.    [provider]  levocetirizine (XYZAL) 5 MG tablet Take 1 tablet (5 mg total) by mouth every evening. Patient not taking: Reported on 07/05/2016 05/24/16   BobbittHeywood Iles, MD  MAGNESIUM MALATE PO Take 200 mg by mouth 2 (two) times daily as needed.    [provider]  montelukast (SINGULAIR) 10 MG tablet Take 1 tablet (10 mg total) by mouth at bedtime. 07/05/16   Bobbitt, Heywood Iles, MD  predniSONE (DELTASONE) 10 MG tablet Take 2 tablets for 4 days, then take 1 tablet for 1 day, then stop. 08/29/16   Bobbitt, Heywood Iles, MD    Allergies Hydrocodone, Imitrex [sumatriptan], Tramadol, Zonisamide, and Amitriptyline   REVIEW OF SYSTEMS  Negative except as noted here or in the History of Present Illness.   PHYSICAL EXAMINATION  Initial Vital Signs Blood pressure (!) 162/101, pulse 83, temperature 97.7 F (36.5 C), temperature source Oral, resp. rate 18, height 5\' 10"  (1.778 m), weight 72.6 kg, SpO2 100 %.  Examination General: Well-developed, well-nourished male in no acute distress; appearance consistent with age of record HENT: normocephalic; atraumatic Eyes: pupils equal, round and reactive to light; extraocular muscles intact  Neck: supple Heart: regular rate and rhythm; no murmurs, rubs or gallops Lungs: clear to auscultation bilaterally Abdomen: soft; nondistended; nontender; no masses or hepatosplenomegaly; bowel sounds present Extremities: No deformity; full range of motion; pulses normal; tenderness of quadriceps Neurologic: Awake, alert and oriented; motor function intact in all extremities and symmetric; no facial droop Skin: Warm and dry Psychiatric: Normal mood and affect   RESULTS  Summary of this visit's results, reviewed and interpreted by myself:   EKG Interpretation  Date/Time:      Ventricular Rate:    PR Interval:    QRS Duration:   QT Interval:    QTC Calculation:   R Axis:     Text Interpretation:        Laboratory Studies: Results for orders placed or performed during the hospital encounter of 11/01/19 (from the past 24 hour(s))  Urinalysis, Routine w reflex microscopic     Status: Abnormal   Collection Time: 11/01/19  4:35 AM  Result Value Ref Range   Color, Urine YELLOW YELLOW   APPearance CLEAR CLEAR   Specific Gravity, Urine 1.010 1.005 - 1.030   pH 6.5 5.0 - 8.0   Glucose, UA NEGATIVE NEGATIVE mg/dL   Hgb urine dipstick LARGE (A) NEGATIVE   Bilirubin Urine NEGATIVE NEGATIVE   Ketones, ur NEGATIVE NEGATIVE mg/dL   Protein, ur 30 (A) NEGATIVE mg/dL   Nitrite NEGATIVE NEGATIVE   Leukocytes,Ua NEGATIVE NEGATIVE  CBC with Differential/Platelet     Status: None   Collection Time: 11/01/19  4:35 AM  Result Value Ref Range   WBC 9.2 4.0 - 10.5 K/uL   RBC 5.25 4.22 - 5.81 MIL/uL   Hemoglobin 15.9 13.0 - 17.0 g/dL   HCT 60.7 39 - 52 %   MCV 88.6 80.0 - 100.0 fL   MCH 30.3 26.0 - 34.0 pg   MCHC 34.2 30.0 - 36.0 g/dL   RDW 37.1 06.2 - 69.4 %   Platelets 197 150 - 400 K/uL   nRBC 0.0 0.0 - 0.2 %   Neutrophils Relative % 52 %   Neutro Abs 4.7 1.7 - 7.7 K/uL   Lymphocytes Relative 38 %   Lymphs Abs 3.5 0.7 - 4.0 K/uL   Monocytes Relative 9 %   Monocytes Absolute 0.8 0 - 1 K/uL   Eosinophils Relative 1 %   Eosinophils Absolute 0.1 0 - 0 K/uL   Basophils Relative 0 %   Basophils Absolute 0.0 0 - 0 K/uL   Immature Granulocytes 0 %   Abs Immature Granulocytes 0.01 0.00 - 0.07 K/uL  Basic metabolic panel     Status: Abnormal   Collection Time: 11/01/19  4:35 AM  Result Value Ref Range   Sodium 136 135 - 145 mmol/L   Potassium 3.0 (L) 3.5 - 5.1 mmol/L   Chloride 99 98 - 111 mmol/L   CO2 27 22 - 32 mmol/L   Glucose, Bld 101 (H) 70 - 99 mg/dL   BUN 13 6 - 20 mg/dL   Creatinine, Ser 8.54 0.61 - 1.24 mg/dL   Calcium 8.6 (L) 8.9 - 10.3 mg/dL   GFR  calc non Af Amer >60 >60 mL/min   GFR calc Af Amer >60 >60 mL/min   Anion gap 10 5 - 15  CK     Status: Abnormal   Collection Time: 11/01/19  4:35 AM  Result Value Ref Range   Total CK >50,000 (H) 49.0 - 397.0 U/L  Urinalysis, Microscopic (reflex)     Status:  None   Collection Time: 11/01/19  4:35 AM  Result Value Ref Range   RBC / HPF 0-5 0 - 5 RBC/hpf   WBC, UA 0-5 0 - 5 WBC/hpf   Bacteria, UA NONE SEEN NONE SEEN   Squamous Epithelial / LPF NONE SEEN 0 - 5   Imaging Studies: No results found.  ED COURSE and MDM  Nursing notes, initial and subsequent vitals signs, including pulse oximetry, reviewed and interpreted by myself.  Vitals:   11/01/19 0410 11/01/19 0412 11/01/19 0607 11/01/19 0608  BP: (!) 162/101  (!) 153/99   Pulse: 83   83  Resp: 18     Temp: 97.7 F (36.5 C)     TempSrc: Oral     SpO2: 100%   100%  Weight:  72.6 kg    Height:  5\' 10"  (1.778 m)     Medications  0.9 %  sodium chloride infusion ( Intravenous New Bag/Given 11/01/19 0607)  sodium chloride 0.9 % bolus 1,000 mL (0 mLs Intravenous Stopped 11/01/19 0608)   5:19 AM 1 L normal saline bolus was initiated for suspected rhabdomyolysis.  Urine was not significantly dark but showed large hemoglobin with no significant red cells, I suspect this represents myoglobin and not hemoglobin which would support the suspicion of rhabdomyolysis.  Serum CK is pending.  5:52 AM Patient has been urinating well while the initial bolus was infusing.  We will continue hydration and admit him now that his CK level confirms a diagnosis of rhabdomyolysis.  6:37 AM Dr. 11/03/19 accepts for admission.  Patient just voided 600 ml of urine.  With this brisk urine output I do not believe he needs alkalinization of his urine at this point.  PROCEDURES  Procedures   CRITICAL CARE Performed by: Toniann Fail Kevis Qu Total critical care time: 30 minutes Critical care time was exclusive of separately billable procedures and treating  other patients. Critical care was necessary to treat or prevent imminent or life-threatening deterioration. Critical care was time spent personally by me on the following activities: development of treatment plan with patient and/or surrogate as well as nursing, discussions with consultants, evaluation of patient's response to treatment, examination of patient, obtaining history from patient or surrogate, ordering and performing treatments and interventions, ordering and review of laboratory studies, ordering and review of radiographic studies, pulse oximetry and re-evaluation of patient's condition.   ED DIAGNOSES     ICD-10-CM   1. Traumatic rhabdomyolysis, initial encounter (HCC)  T79.6XXA   2. Hypokalemia  E87.6        Shrihan Putt, Carlisle Beers, MD 11/01/19 [phone]

## 2019-11-01 NOTE — H&P (Signed)
[facility] History and Physical   Patient: Stephen Conner EVO:[id number]   PCP: Maye Hides, PA DOB: 1975/08/15   DOA: 11/01/2019   DOS: 11/01/2019   DOS: the patient was seen and examined on 11/01/2019  Patient coming from: The patient is coming from Home  Chief Complaint: Dark-colored urine  HPI: Stephen Conner is a 44 y.o. male with Past medical history of testosterone deficiency. Patient presents with complaints of dark-colored urine. 2 days ago he went for CrossFit training first session.  After that he had severe pain and soreness in bilateral thigh area.  No nausea no vomiting no fever no chills.  No diarrhea no constipation.  No chest pain. His urine was clear. Yesterday he went for another CrossFit session.  Prior to session he noticed that his urine was significantly dark in color. After the session his urine continues to remain dark and remain cloudy. And continues to have soreness of bilateral thigh muscles and therefore decided to come to the emergent care.  At the time of my evaluation pain is improving.  Urine is clear.  No nausea no vomiting.  No fever no chills.  No chest pain abdomen.  ED Course: CK level was checked which was undetectable high.  Creatinine stable.  Urine shows evidence of heme positive.  Patient was started on IV fluid after a normal saline bolus 1 L.  Transferred for admission.  At his baseline ambulates without assistance independent for most of his ADL;  manages his medication on his own.  Review of Systems: as mentioned in the history of present illness.  All other systems reviewed and are negative.  Past Medical History:  Diagnosis Date  . Asthma    As a child  . Urticaria    Past Surgical History:  Procedure Laterality Date  . SHOULDER SURGERY     Right x 2    Social History:  reports that he has never smoked. He has never used smokeless tobacco. He reports current alcohol use. He reports that he does not use  drugs.  Allergies  Allergen Reactions  . Hydrocodone Itching  . Imitrex [Sumatriptan] Anaphylaxis  . Tramadol Other (See Comments)    twitchy  . Zonisamide Rash  . Amitriptyline Other (See Comments)    Per pt - no mood   Family history reviewed and not pertinent Family History  Problem Relation Age of Onset  . Fibromyalgia Mother   . Hypertension Mother   . Multiple sclerosis Brother      Prior to Admission medications   Medication Sig Start Date End Date Taking? Authorizing Provider  Cholecalciferol (VITAMIN D3) 125 MCG (5000 UT) TABS Take 1 tablet by mouth daily.   Yes [provider]  GLUTATHIONE PO Take 222 mg by mouth every morning.   Yes [provider]  MAGNESIUM PO Take 1-3 tablets by mouth in the morning and at bedtime. Take 1 tablet in the morning and Take 3 tablets at bedtime   Yes [provider]  Menaquinone-7 (VITAMIN K2 PO) Take 150 mcg by mouth daily.   Yes [provider]  Nutritional Supplements (DHEA PO) Take 15 mg by mouth at bedtime.   Yes [provider]  OVER THE COUNTER MEDICATION Take 2 tablets by mouth every morning. Dopa Mucuna   Yes [provider]  Pregnenolone Micronized (PREGNENOLONE PO) Take 75 mg by mouth at bedtime.   Yes [provider]  testosterone cypionate (DEPOTESTOSTERONE CYPIONATE) 200 MG/ML injection Inject 56 mg into  the muscle 2 (two) times a week.  06/04/19  Yes [provider]  vitamin B-12 (CYANOCOBALAMIN) 1000 MCG tablet Take 1,000 mcg by mouth daily.   Yes [provider]    Physical Exam: Vitals:   11/01/19 1500 11/01/19 1600 11/01/19 1709 11/01/19 1814  BP: (!) 150/87 (!) 146/84 (!) 179/95 (!) 157/89  Pulse:  81 83 69  Resp: 16 17  16   Temp:  98.3 F (36.8 C) 98.6 F (37 C) 98.6 F (37 C)  TempSrc:   Oral Oral  SpO2: 99% 100% 100% 98%  Weight:      Height:        General: alert and oriented to time, place, and person. Appear in mild  distress, affect appropriate Eyes: PERRL, Conjunctiva normal ENT: Oral Mucosa Clear, moist  Neck: no JVD, no Abnormal Mass Or lumps Cardiovascular: S1 and S2 Present, no Murmur, peripheral pulses symmetrical Respiratory: good respiratory effort, Bilateral Air entry equal and Decreased, no signs of accessory muscle use, Clear to Auscultation, no Crackles, no wheezes Abdomen: Bowel Sound present, Soft and no tenderness, no hernia Skin: no rashes  Extremities: no Pedal edema, no calf tenderness Neurologic: without any new focal findings Gait not checked due to patient safety concerns  Data Reviewed: I have personally reviewed and interpreted labs, imaging as discussed below.  CBC: Recent Labs  Lab 11/01/19 0435  WBC 9.2  NEUTROABS 4.7  HGB 15.9  HCT 46.5  MCV 88.6  PLT 197   Basic Metabolic Panel: Recent Labs  Lab 11/01/19 0435  NA 136  K 3.0*  CL 99  CO2 27  GLUCOSE 101*  BUN 13  CREATININE 1.24  CALCIUM 8.6*   GFR: Estimated Creatinine Clearance: 79.7 mL/min (by C-G formula based on SCr of 1.24 mg/dL). Liver Function Tests: No results for input(s): AST, ALT, ALKPHOS, BILITOT, PROT, ALBUMIN in the last 168 hours. No results for input(s): LIPASE, AMYLASE in the last 168 hours. No results for input(s): AMMONIA in the last 168 hours. Coagulation Profile: No results for input(s): INR, PROTIME in the last 168 hours. Cardiac Enzymes: Recent Labs  Lab 11/01/19 0435  CKTOTAL >50,000*   BNP (last 3 results) No results for input(s): PROBNP in the last 8760 hours. HbA1C: No results for input(s): HGBA1C in the last 72 hours. CBG: No results for input(s): GLUCAP in the last 168 hours. Lipid Profile: No results for input(s): CHOL, HDL, LDLCALC, TRIG, CHOLHDL, LDLDIRECT in the last 72 hours. Thyroid Function Tests: No results for input(s): TSH, T4TOTAL, FREET4, T3FREE, THYROIDAB in the last 72 hours. Anemia Panel: No results for input(s): VITAMINB12, FOLATE, FERRITIN,  TIBC, IRON, RETICCTPCT in the last 72 hours. Urine analysis:    Component Value Date/Time   COLORURINE YELLOW 11/01/2019 0435   APPEARANCEUR CLEAR 11/01/2019 0435   LABSPEC 1.010 11/01/2019 0435   PHURINE 6.5 11/01/2019 0435   GLUCOSEU NEGATIVE 11/01/2019 0435   HGBUR LARGE (A) 11/01/2019 0435   BILIRUBINUR NEGATIVE 11/01/2019 0435   KETONESUR NEGATIVE 11/01/2019 0435   PROTEINUR 30 (A) 11/01/2019 0435   NITRITE NEGATIVE 11/01/2019 0435   LEUKOCYTESUR NEGATIVE 11/01/2019 0435   I reviewed all nursing notes, pharmacy notes, vitals, pertinent old records.  Assessment/Plan 1. Rhabdomyolysis CK significantly high. Likely from excessive exertion. Urine is now clear. Continue with aggressive IV hydration. Check CK. Patient was on normal saline to 50 cc/h we will reduce it to 200 cc/h. Monitor for volume overload as well as metabolic acidosis from normal saline.  2.  Hypokalemia Corrected. Monitor.  Nutrition: Cardiac diet DVT Prophylaxis: Subcutaneous Heparin   Advance goals of care discussion: Full code   Consults: none   Family Communication: family was present at bedside, at the time of interview.  Opportunity was given to ask question and all questions were answered satisfactorily.   Disposition:  Status is: Observation  Author: Lynden Oxford, MD [facility] 11/01/2019 7:50 PM   To reach On-call, see care teams to locate the attending and reach out to them via [email or web address]. If 7PM-7AM, please contact night-coverage If you still have difficulty reaching the attending provider, please page the [facility] (Director on Call) for [facility] on amion for assistance.

## 2019-11-01 NOTE — ED Triage Notes (Signed)
Pt states his urine is dark in color  States started exercising this week  States his quads have felt very tight

## 2019-11-02 ENCOUNTER — Observation Stay (HOSPITAL_COMMUNITY): Payer: Managed Care, Other (non HMO)

## 2019-11-02 DIAGNOSIS — M6282 Rhabdomyolysis: Secondary | ICD-10-CM | POA: Diagnosis not present

## 2019-11-02 LAB — COMPREHENSIVE METABOLIC PANEL
ALT: 151 U/L — ABNORMAL HIGH (ref 0–44)
AST: 546 U/L — ABNORMAL HIGH (ref 15–41)
Albumin: 3.3 g/dL — ABNORMAL LOW (ref 3.5–5.0)
Alkaline Phosphatase: 35 U/L — ABNORMAL LOW (ref 38–126)
Anion gap: 7 (ref 5–15)
BUN: 10 mg/dL (ref 6–20)
CO2: 26 mmol/L (ref 22–32)
Calcium: 7.9 mg/dL — ABNORMAL LOW (ref 8.9–10.3)
Chloride: 106 mmol/L (ref 98–111)
Creatinine, Ser: 1.05 mg/dL (ref 0.61–1.24)
GFR calc Af Amer: >60 mL/min (ref >60)
GFR calc non Af Amer: >60 mL/min (ref >60)
Glucose, Bld: 94 mg/dL (ref 70–99)
Potassium: 3.9 mmol/L (ref 3.5–5.1)
Sodium: 139 mmol/L (ref 135–145)
Total Bilirubin: 0.9 mg/dL (ref 0.3–1.2)
Total Protein: 5.9 g/dL — ABNORMAL LOW (ref 6.5–8.1)

## 2019-11-02 LAB — CK
Total CK: 47412 U/L — ABNORMAL HIGH (ref 49–397)
Total CK: 15674 U/L — ABNORMAL HIGH (ref 49–397)

## 2019-11-02 LAB — CBC
HCT: 42.2 % (ref 39.0–52.0)
Hemoglobin: 14 g/dL (ref 13.0–17.0)
MCH: 30.2 pg (ref 26.0–34.0)
MCHC: 33.2 g/dL (ref 30.0–36.0)
MCV: 91.1 fL (ref 80.0–100.0)
Platelets: 161 10*3/uL (ref 150–400)
RBC: 4.63 MIL/uL (ref 4.22–5.81)
RDW: 12.3 % (ref 11.5–15.5)
WBC: 5.6 10*3/uL (ref 4.0–10.5)
nRBC: 0 % (ref 0.0–0.2)

## 2019-11-02 LAB — HIV ANTIBODY (ROUTINE TESTING W REFLEX): HIV Screen 4th Generation wRfx: NONREACTIVE

## 2019-11-02 MED ORDER — IBUPROFEN 200 MG PO TABS
400.0000 mg | ORAL_TABLET | Freq: Once | ORAL | Status: AC
  Administered 2019-11-02: 400 mg via ORAL
  Filled 2019-11-02: qty 2

## 2019-11-02 MED ORDER — BUTALBITAL-APAP-CAFFEINE 50-325-40 MG PO TABS: 1.0000 | ORAL_TABLET | Freq: Four times a day (QID) | ORAL | Status: DC | PRN

## 2019-11-02 MED ORDER — BUTALBITAL-APAP-CAFFEINE 50-325-40 MG PO TABS: 1.0000 | ORAL_TABLET | Freq: Two times a day (BID) | ORAL | Status: DC | PRN

## 2019-11-02 MED ORDER — SODIUM CHLORIDE 0.9 % IV BOLUS
1000.0000 mL | INTRAVENOUS | Status: AC
  Administered 2019-11-02 (×2): 1000 mL via INTRAVENOUS

## 2019-11-02 MED ORDER — IBUPROFEN 200 MG PO TABS
400.0000 mg | ORAL_TABLET | Freq: Two times a day (BID) | ORAL | Status: DC | PRN
  Administered 2019-11-02 – 2019-11-05 (×3): 400 mg via ORAL
  Filled 2019-11-02 (×3): qty 2

## 2019-11-02 MED ORDER — SODIUM CHLORIDE 0.9 % BOLUS PEDS: 1000.0000 mL | INTRAVENOUS | Status: DC

## 2019-11-02 NOTE — Progress Notes (Signed)
[facility] Progress Note  Patient: Stephen Conner    WUX:[id number]  DOA: 11/01/2019     Date of Service: the patient was seen and examined on 11/02/2019  Brief hospital course: Past medical history of testosterone deficiency.  Presents with Coca-Cola colored urine.  Found to have nontraumatic rhabdomyolysis. Currently plan is continue aggressive hydration.  Assessment and Plan: 1. Rhabdomyolysis CK significantly high. Likely from excessive exertion. Urine is now clear. Continue with aggressive IV hydration. Check CK. Patient was on normal saline to 50 cc/h we will reduce it to 200 cc/h. Monitor for volume overload as well as metabolic acidosis from normal saline.  2.  Hypokalemia Corrected. Monitor.  3.  History of migraine Currently requesting to continue ibuprofen.  Monitor renal function.  4.  Elevated LFT Likely in the setting of rhabdomyolysis. Ultrasound abdomen negative.  No right upper quadrant pain.  5.  Diffuse erythema and redness Patient reports having history of chronic idiopathic urticaria for which he was on cholestyramine and prednisone which she stopped taking in May.  Diet: regular diet DVT Prophylaxis: enoxaparin (LOVENOX) injection 40 mg Start: 11/01/19 2000    Advance goals of care discussion: Full code  Family Communication: no family was present at bedside, at the time of interview.   Subjective: reported rash. No acute complains.   Physical Exam:  General: Appear in mild distress, diffuse rednedd Rash; Oral Mucosa Clear, moist. no Abnormal Neck Mass Or lumps, Conjunctiva normal  Cardiovascular: S1 and S2 Present, no Murmur, Respiratory: good respiratory effort, Bilateral Air entry present and Clear to Auscultation, no Crackles, no wheezes Abdomen: Bowel Sound present, Soft and no tenderness Extremities: no Pedal edema, no calf tenderness Neurology: alert and oriented to time, place, and person affect appropriate. no new focal  deficit Gait not checked due to patient safety concerns   Vitals:   11/02/19 0136 11/02/19 0601 11/02/19 1138 11/02/19 1454  BP: 136/86 138/86 (!) 147/76 (!) 150/90  Pulse: 71 72 69 72  Resp: 14 14  14   Temp: 97.9 F (36.6 C) 98.5 F (36.9 C)  97.9 F (36.6 C)  TempSrc: Oral Oral  Oral  SpO2: 98% 98%  98%  Weight:      Height:        Intake/Output Summary (Last 24 hours) at 11/02/2019 1925 Last data filed at 11/02/2019 1900 Gross per 24 hour  Intake 3866.7 ml  Output 5500 ml  Net -1633.3 ml   Filed Weights   11/01/19 0412  Weight: 72.6 kg    Data Reviewed: I have personally reviewed and interpreted daily labs, tele strips, imagings as discussed above. I reviewed all nursing notes, pharmacy notes, vitals, pertinent old records I have discussed plan of care as described above with RN and patient/family.  CBC: Recent Labs  Lab 11/01/19 0435 11/02/19 0559  WBC 9.2 5.6  NEUTROABS 4.7  --   HGB 15.9 14.0  HCT 46.5 42.2  MCV 88.6 91.1  PLT 197 161   Basic Metabolic Panel: Recent Labs  Lab 11/01/19 0435 11/02/19 0559  NA 136 139  K 3.0* 3.9  CL 99 106  CO2 27 26  GLUCOSE 101* 94  BUN 13 10  CREATININE 1.24 1.05  CALCIUM 8.6* 7.9*    Studies: 11/04/19 Abdomen Limited RUQ  Result Date: 11/02/2019 CLINICAL DATA:  Elevated liver function test. EXAM: ULTRASOUND ABDOMEN LIMITED RIGHT UPPER QUADRANT COMPARISON:  None. FINDINGS: Gallbladder: No gallstones or wall thickening visualized. No sonographic Murphy sign noted by sonographer. Common bile  duct: Diameter: 0.2 cm Liver: No focal lesion identified. Within normal limits in parenchymal echogenicity. Portal vein is patent on color Doppler imaging with normal direction of blood flow towards the liver. Other: None. IMPRESSION: Normal exam. Electronically Signed   By: Drusilla Kanner M.D.   On: 11/02/2019 14:42    Scheduled Meds: . enoxaparin (LOVENOX) injection  40 mg Subcutaneous Q24H   Continuous Infusions: . sodium  chloride 200 mL/hr at 11/02/19 1803   PRN Meds: acetaminophen **OR** acetaminophen, butalbital-acetaminophen-caffeine, ibuprofen, ondansetron **OR** ondansetron (ZOFRAN) IV, senna-docusate  Time spent: 35 minutes  Author: Lynden Oxford, MD [facility] 11/02/2019 7:25 PM  To reach On-call, see care teams to locate the attending and reach out via [email or web address]. Between 7PM-7AM, please contact night-coverage If you still have difficulty reaching the attending provider, please page the [facility] (Director on Call) for [facility] on amion for assistance.

## 2019-11-03 ENCOUNTER — Inpatient Hospital Stay (HOSPITAL_COMMUNITY): Payer: Managed Care, Other (non HMO)

## 2019-11-03 DIAGNOSIS — R21 Rash and other nonspecific skin eruption: Secondary | ICD-10-CM | POA: Diagnosis present

## 2019-11-03 DIAGNOSIS — Z820 Family history of epilepsy and other diseases of the nervous system: Secondary | ICD-10-CM | POA: Diagnosis not present

## 2019-11-03 DIAGNOSIS — L501 Idiopathic urticaria: Secondary | ICD-10-CM | POA: Diagnosis present

## 2019-11-03 DIAGNOSIS — M6282 Rhabdomyolysis: Secondary | ICD-10-CM | POA: Diagnosis present

## 2019-11-03 DIAGNOSIS — R8290 Unspecified abnormal findings in urine: Secondary | ICD-10-CM | POA: Diagnosis present

## 2019-11-03 DIAGNOSIS — E291 Testicular hypofunction: Secondary | ICD-10-CM | POA: Diagnosis present

## 2019-11-03 DIAGNOSIS — Z885 Allergy status to narcotic agent status: Secondary | ICD-10-CM | POA: Diagnosis not present

## 2019-11-03 DIAGNOSIS — E876 Hypokalemia: Secondary | ICD-10-CM | POA: Diagnosis present

## 2019-11-03 DIAGNOSIS — Z79899 Other long term (current) drug therapy: Secondary | ICD-10-CM | POA: Diagnosis not present

## 2019-11-03 DIAGNOSIS — R7989 Other specified abnormal findings of blood chemistry: Secondary | ICD-10-CM | POA: Diagnosis present

## 2019-11-03 DIAGNOSIS — Z8249 Family history of ischemic heart disease and other diseases of the circulatory system: Secondary | ICD-10-CM | POA: Diagnosis not present

## 2019-11-03 DIAGNOSIS — Z888 Allergy status to other drugs, medicaments and biological substances status: Secondary | ICD-10-CM | POA: Diagnosis not present

## 2019-11-03 DIAGNOSIS — G43909 Migraine, unspecified, not intractable, without status migrainosus: Secondary | ICD-10-CM | POA: Diagnosis present

## 2019-11-03 DIAGNOSIS — Z20822 Contact with and (suspected) exposure to covid-19: Secondary | ICD-10-CM | POA: Diagnosis present

## 2019-11-03 LAB — CBC
HCT: 45.8 % (ref 39.0–52.0)
Hemoglobin: 15 g/dL (ref 13.0–17.0)
MCH: 29.8 pg (ref 26.0–34.0)
MCHC: 32.8 g/dL (ref 30.0–36.0)
MCV: 90.9 fL (ref 80.0–100.0)
Platelets: 180 10*3/uL (ref 150–400)
RBC: 5.04 MIL/uL (ref 4.22–5.81)
RDW: 12.1 % (ref 11.5–15.5)
WBC: 6.6 10*3/uL (ref 4.0–10.5)
nRBC: 0 % (ref 0.0–0.2)

## 2019-11-03 LAB — COMPREHENSIVE METABOLIC PANEL
ALT: 152 U/L — ABNORMAL HIGH (ref 0–44)
AST: 429 U/L — ABNORMAL HIGH (ref 15–41)
Albumin: 3.4 g/dL — ABNORMAL LOW (ref 3.5–5.0)
Alkaline Phosphatase: 36 U/L — ABNORMAL LOW (ref 38–126)
Anion gap: 7 (ref 5–15)
BUN: 10 mg/dL (ref 6–20)
CO2: 25 mmol/L (ref 22–32)
Calcium: 8.3 mg/dL — ABNORMAL LOW (ref 8.9–10.3)
Chloride: 107 mmol/L (ref 98–111)
Creatinine, Ser: 1.03 mg/dL (ref 0.61–1.24)
GFR calc Af Amer: >60 mL/min (ref >60)
GFR calc non Af Amer: >60 mL/min (ref >60)
Glucose, Bld: 86 mg/dL (ref 70–99)
Potassium: 4 mmol/L (ref 3.5–5.1)
Sodium: 139 mmol/L (ref 135–145)
Total Bilirubin: 1.1 mg/dL (ref 0.3–1.2)
Total Protein: 5.8 g/dL — ABNORMAL LOW (ref 6.5–8.1)

## 2019-11-03 LAB — CK
Total CK: 10950 U/L — ABNORMAL HIGH (ref 49–397)
Total CK: 9858 U/L — ABNORMAL HIGH (ref 49–397)

## 2019-11-03 MED ORDER — SODIUM CHLORIDE 0.9 % IV BOLUS
1000.0000 mL | Freq: Once | INTRAVENOUS | Status: AC
  Administered 2019-11-03: 1000 mL via INTRAVENOUS

## 2019-11-03 NOTE — Plan of Care (Signed)

## 2019-11-03 NOTE — Progress Notes (Signed)
[facility] Progress Note  Patient: Stephen Conner    DGL:[id number]  DOA: 11/01/2019     Date of Service: the patient was seen and examined on 11/03/2019  Brief hospital course: Past medical history of testosterone deficiency.  Presents with [employer] colored urine.  Found to have nontraumatic rhabdomyolysis. Currently plan is continue aggressive hydration.  Assessment and Plan: 1. Rhabdomyolysis CK significantly high. Likely from excessive exertion. Urine is now clear. Continue with aggressive IV hydration. Twice daily CK. Now trending down. Continue IV hydration. Discharge home once CK is less than 5000.  2.  Hypokalemia Corrected. Monitor.  3.  History of migraine Currently requesting to continue ibuprofen.  Monitor renal function.  4.  Elevated LFT Likely in the setting of rhabdomyolysis. Ultrasound abdomen negative.  No right upper quadrant pain.  5.  Diffuse erythema and redness Patient reports having history of chronic idiopathic urticaria for which he was on cholestyramine and prednisone which she stopped taking in May. Rash is actually improving.  Monitor.  Diet: regular diet DVT Prophylaxis: enoxaparin (LOVENOX) injection 40 mg Start: 11/01/19 2000    Advance goals of care discussion: Full code  Family Communication: no family was present at bedside, at the time of interview.   Subjective: Rash improving.  No nausea no vomiting.  No fever no chills.  No abdominal pain.  No diarrhea no constipation.  Physical Exam:  General: Appear in mild distress, diffuse redness improving, no other rash; Oral Mucosa Clear, moist. no Abnormal Neck Mass Or lumps, Conjunctiva normal  Cardiovascular: S1 and S2 Present, no Murmur, Respiratory: good respiratory effort, Bilateral Air entry present and Clear to Auscultation, no Crackles, no wheezes Abdomen: Bowel Sound present, Soft and no tenderness Extremities: no Pedal edema, no calf tenderness Neurology: alert and  oriented to time, place, and person affect appropriate. no new focal deficit Gait not checked due to patient safety concerns  Vitals:   11/02/19 1454 11/02/19 2051 11/03/19 0509 11/03/19 1259  BP: (!) 150/90 (!) 150/95 140/80 (!) 146/83  Pulse: 72 65 63 66  Resp: 14 18 18 13   Temp: 97.9 F (36.6 C) 98.5 F (36.9 C) 98.6 F (37 C) 99.1 F (37.3 C)  TempSrc: Oral Oral Oral Oral  SpO2: 98% 97% 96% 97%  Weight:      Height:        Intake/Output Summary (Last 24 hours) at 11/03/2019 1949 Last data filed at 11/03/2019 1915 Gross per 24 hour  Intake 2340 ml  Output 6475 ml  Net -4135 ml   Filed Weights   11/01/19 0412  Weight: 72.6 kg    Data Reviewed: I have personally reviewed and interpreted daily labs, tele strips, imagings as discussed above. I reviewed all nursing notes, pharmacy notes, vitals, pertinent old records I have discussed plan of care as described above with RN and patient/family.  CBC: Recent Labs  Lab 11/01/19 0435 11/02/19 0559 11/03/19 0629  WBC 9.2 5.6 6.6  NEUTROABS 4.7  --   --   HGB 15.9 14.0 15.0  HCT 46.5 42.2 45.8  MCV 88.6 91.1 90.9  PLT 197 161 180   Basic Metabolic Panel: Recent Labs  Lab 11/01/19 0435 11/02/19 0559 11/03/19 0629  NA 136 139 139  K 3.0* 3.9 4.0  CL 99 106 107  CO2 27 26 25   GLUCOSE 101* 94 86  BUN 13 10 10   CREATININE 1.24 1.05 1.03  CALCIUM 8.6* 7.9* 8.3*    Studies: No results found.  Scheduled Meds: .  enoxaparin (LOVENOX) injection  40 mg Subcutaneous Q24H   Continuous Infusions: . sodium chloride 200 mL/hr at 11/03/19 0931   PRN Meds: acetaminophen **OR** acetaminophen, butalbital-acetaminophen-caffeine, ibuprofen, ondansetron **OR** ondansetron (ZOFRAN) IV, senna-docusate  Time spent: 35 minutes  Author: Lynden Oxford, MD [facility] 11/03/2019 7:49 PM  To reach On-call, see care teams to locate the attending and reach out via [email or web address]. Between 7PM-7AM, please contact  night-coverage If you still have difficulty reaching the attending provider, please page the [facility] (Director on Call) for [facility] on amion for assistance.

## 2019-11-04 LAB — CK
Total CK: 16733 U/L — ABNORMAL HIGH (ref 49–397)
Total CK: 12284 U/L — ABNORMAL HIGH (ref 49–397)

## 2019-11-04 LAB — COMPREHENSIVE METABOLIC PANEL
ALT: 140 U/L — ABNORMAL HIGH (ref 0–44)
AST: 299 U/L — ABNORMAL HIGH (ref 15–41)
Albumin: 3.3 g/dL — ABNORMAL LOW (ref 3.5–5.0)
Alkaline Phosphatase: 35 U/L — ABNORMAL LOW (ref 38–126)
Anion gap: 6 (ref 5–15)
BUN: 13 mg/dL (ref 6–20)
CO2: 28 mmol/L (ref 22–32)
Calcium: 8.4 mg/dL — ABNORMAL LOW (ref 8.9–10.3)
Chloride: 106 mmol/L (ref 98–111)
Creatinine, Ser: 0.9 mg/dL (ref 0.61–1.24)
GFR calc Af Amer: >60 mL/min (ref >60)
GFR calc non Af Amer: >60 mL/min (ref >60)
Glucose, Bld: 92 mg/dL (ref 70–99)
Potassium: 3.8 mmol/L (ref 3.5–5.1)
Sodium: 140 mmol/L (ref 135–145)
Total Bilirubin: 0.8 mg/dL (ref 0.3–1.2)
Total Protein: 5.8 g/dL — ABNORMAL LOW (ref 6.5–8.1)

## 2019-11-04 LAB — CBC
HCT: 43.5 % (ref 39.0–52.0)
Hemoglobin: 14.7 g/dL (ref 13.0–17.0)
MCH: 30.5 pg (ref 26.0–34.0)
MCHC: 33.8 g/dL (ref 30.0–36.0)
MCV: 90.2 fL (ref 80.0–100.0)
Platelets: 170 10*3/uL (ref 150–400)
RBC: 4.82 MIL/uL (ref 4.22–5.81)
RDW: 12 % (ref 11.5–15.5)
WBC: 7.1 10*3/uL (ref 4.0–10.5)
nRBC: 0 % (ref 0.0–0.2)

## 2019-11-04 MED ORDER — SODIUM CHLORIDE 0.9 % IV BOLUS
1000.0000 mL | INTRAVENOUS | Status: AC
  Administered 2019-11-04 (×2): 1000 mL via INTRAVENOUS

## 2019-11-04 MED ORDER — LACTATED RINGERS IV BOLUS
1000.0000 mL | Freq: Once | INTRAVENOUS | Status: AC
  Administered 2019-11-04: 1000 mL via INTRAVENOUS

## 2019-11-04 NOTE — Progress Notes (Signed)
[facility] Progress Note  Patient: Stephen Conner    KCL:[id number]  DOA: 11/01/2019     Date of Service: the patient was seen and examined on 11/04/2019  Brief hospital course: Past medical history of testosterone deficiency.  Presents with [employer] colored urine.  Found to have nontraumatic rhabdomyolysis. Currently plan is continue aggressive hydration.  Assessment and Plan: 1. Rhabdomyolysis CK significantly high. Likely from excessive exertion. Urine is now clear. Continue with aggressive IV hydration. Twice daily CK. Now trending down. Continue IV hydration. Discharge home once CK is less than 5000.  2.  Hypokalemia Corrected. Monitor.  3.  History of migraine Currently requesting to continue ibuprofen.  Monitor renal function.  4.  Elevated LFT Likely in the setting of rhabdomyolysis. Ultrasound abdomen negative.  No right upper quadrant pain.  5.  Diffuse erythema and redness Patient reports having history of chronic idiopathic urticaria for which he was on cholestyramine and prednisone which she stopped taking in May. Rash is actually improving.  Monitor.  Diet: regular diet DVT Prophylaxis: enoxaparin (LOVENOX) injection 40 mg Start: 11/01/19 2000    Advance goals of care discussion: Full code  Family Communication: no family was present at bedside, at the time of interview.   Subjective: No acute complaint.  No nausea no vomiting.  No fever no chills.  No muscle pain.  Physical Exam:  General: Appear in mild distress, diffuse redness improving, no other rash; Oral Mucosa Clear, moist. no Abnormal Neck Mass Or lumps, Conjunctiva normal  Cardiovascular: S1 and S2 Present, no Murmur, Respiratory: good respiratory effort, Bilateral Air entry present and Clear to Auscultation, no Crackles, no wheezes Abdomen: Bowel Sound present, Soft and no tenderness Extremities: no Pedal edema, no calf tenderness Neurology: alert and oriented to time, place, and  person affect appropriate. no new focal deficit Gait not checked due to patient safety concerns  Vitals:   11/03/19 0509 11/03/19 1259 11/03/19 2024 11/04/19 1255  BP: 140/80 (!) 146/83 (!) 149/87 131/79  Pulse: 63 66 67 60  Resp: 18 13 18    Temp: 98.6 F (37 C) 99.1 F (37.3 C) 98.1 F (36.7 C) 98.9 F (37.2 C)  TempSrc: Oral Oral  Oral  SpO2: 96% 97% 96% 97%  Weight:      Height:        Intake/Output Summary (Last 24 hours) at 11/04/2019 1916 Last data filed at 11/04/2019 1616 Gross per 24 hour  Intake --  Output 6950 ml  Net -6950 ml   Filed Weights   11/01/19 0412  Weight: 72.6 kg    Data Reviewed: I have personally reviewed and interpreted daily labs, tele strips, imagings as discussed above. I reviewed all nursing notes, pharmacy notes, vitals, pertinent old records I have discussed plan of care as described above with RN and patient/family.  CBC: Recent Labs  Lab 11/01/19 0435 11/02/19 0559 11/03/19 0629 11/04/19 0535  WBC 9.2 5.6 6.6 7.1  NEUTROABS 4.7  --   --   --   HGB 15.9 14.0 15.0 14.7  HCT 46.5 42.2 45.8 43.5  MCV 88.6 91.1 90.9 90.2  PLT 197 161 180 170   Basic Metabolic Panel: Recent Labs  Lab 11/01/19 0435 11/02/19 0559 11/03/19 0629 11/04/19 0535  NA 136 139 139 140  K 3.0* 3.9 4.0 3.8  CL 99 106 107 106  CO2 27 26 25 28   GLUCOSE 101* 94 86 92  BUN 13 10 10 13   CREATININE 1.24 1.05 1.03 0.90  CALCIUM 8.6*  7.9* 8.3* 8.4*    Studies: DG CHEST PORT 1 VIEW  Result Date: 11/03/2019 CLINICAL DATA:  Clinical colo colored urine. EXAM: PORTABLE CHEST 1 VIEW COMPARISON:  07/23/2018 FINDINGS: The heart size is normal. There is no pneumothorax. There is a probable trace right-sided effusion. No significant left-sided pleural effusion. IMPRESSION: Probable trace right-sided pleural effusion. Electronically Signed   By: Katherine Mantle M.D.   On: 11/03/2019 21:41    Scheduled Meds: . enoxaparin (LOVENOX) injection  40 mg Subcutaneous  Q24H   Continuous Infusions: . sodium chloride 200 mL/hr at 11/04/19 1816  . lactated ringers     PRN Meds: acetaminophen **OR** acetaminophen, butalbital-acetaminophen-caffeine, ibuprofen, ondansetron **OR** ondansetron (ZOFRAN) IV, senna-docusate  Time spent: 35 minutes  Author: Lynden Oxford, MD [facility] 11/04/2019 7:16 PM  To reach On-call, see care teams to locate the attending and reach out via [email or web address]. Between 7PM-7AM, please contact night-coverage If you still have difficulty reaching the attending provider, please page the [facility] (Director on Call) for [facility] on amion for assistance.

## 2019-11-05 LAB — COMPREHENSIVE METABOLIC PANEL
ALT: 148 U/L — ABNORMAL HIGH (ref 0–44)
AST: 221 U/L — ABNORMAL HIGH (ref 15–41)
Albumin: 3.5 g/dL (ref 3.5–5.0)
Alkaline Phosphatase: 44 U/L (ref 38–126)
Anion gap: 8 (ref 5–15)
BUN: 11 mg/dL (ref 6–20)
CO2: 26 mmol/L (ref 22–32)
Calcium: 8.8 mg/dL — ABNORMAL LOW (ref 8.9–10.3)
Chloride: 106 mmol/L (ref 98–111)
Creatinine, Ser: 1 mg/dL (ref 0.61–1.24)
GFR calc Af Amer: >60 mL/min (ref >60)
GFR calc non Af Amer: >60 mL/min (ref >60)
Glucose, Bld: 89 mg/dL (ref 70–99)
Potassium: 3.7 mmol/L (ref 3.5–5.1)
Sodium: 140 mmol/L (ref 135–145)
Total Bilirubin: 0.9 mg/dL (ref 0.3–1.2)
Total Protein: 6.1 g/dL — ABNORMAL LOW (ref 6.5–8.1)

## 2019-11-05 LAB — CBC
HCT: 47.2 % (ref 39.0–52.0)
Hemoglobin: 15.5 g/dL (ref 13.0–17.0)
MCH: 29.9 pg (ref 26.0–34.0)
MCHC: 32.8 g/dL (ref 30.0–36.0)
MCV: 91.1 fL (ref 80.0–100.0)
Platelets: 179 10*3/uL (ref 150–400)
RBC: 5.18 MIL/uL (ref 4.22–5.81)
RDW: 12.1 % (ref 11.5–15.5)
WBC: 7.8 10*3/uL (ref 4.0–10.5)
nRBC: 0 % (ref 0.0–0.2)

## 2019-11-05 LAB — SEDIMENTATION RATE: Sed Rate: 5 mm/hr (ref 0–16)

## 2019-11-05 LAB — CK: Total CK: 8887 U/L — ABNORMAL HIGH (ref 49–397)

## 2019-11-05 LAB — C-REACTIVE PROTEIN: CRP: 0.6 mg/dL (ref <1.0)

## 2019-11-05 NOTE — Plan of Care (Signed)
  Problem: Health Behavior/Discharge Planning: Goal: Ability to manage health-related needs will improve Outcome: Progressing   Problem: Clinical Measurements: Goal: Ability to maintain clinical measurements within normal limits will improve Outcome: Progressing Goal: Diagnostic test results will improve Outcome: Progressing Goal: Respiratory complications will improve Outcome: Progressing Goal: Cardiovascular complication will be avoided Outcome: Progressing   Problem: Activity: Goal: Risk for activity intolerance will decrease Outcome: Progressing   Problem: Nutrition: Goal: Adequate nutrition will be maintained Outcome: Progressing   Problem: Coping: Goal: Level of anxiety will decrease Outcome: Progressing   Problem: Elimination: Goal: Will not experience complications related to bowel motility Outcome: Progressing

## 2019-11-06 LAB — ANTINUCLEAR ANTIBODIES, IFA: ANA Ab, IFA: NEGATIVE

## 2019-11-11 NOTE — Discharge Summary (Signed)
[facility] Discharge Summary   Patient: Korban Shearer WUJ:[id number]  PCP: Maye Hides, PA  Date of admission: 11/01/2019   Date of discharge: 11/05/2019      Discharge Diagnoses:  Principal diagnosis Non traumatic rhabdomyolysis Active Problems:   Seasonal and perennial allergic rhinitis   Hypokalemia   Testosterone deficiency   Admitted From: home Disposition:  Home   Recommendations for Outpatient Follow-up:  1. PCP: follow up in 1 week with repeat LFT and CK 2. Follow up LABS/TEST:  LFT and CK   Follow-up Information    Maye Hides, PA. Schedule an appointment as soon as possible for a visit in 1 week(s).   Specialty: Physician Assistant Contact information: [phone] [place] [address] [phone]              Diet recommendation: Regular diet  Activity: The patient is advised to gradually reintroduce usual activities, as tolerated  Discharge Condition: stable  Code Status: Full code   History of present illness: As per the H and P dictated on admission, "Ruford Dudzinski is a 44 y.o. male with Past medical history of testosterone deficiency. Patient presents with complaints of dark-colored urine. 2 days ago he went for [employer] training first session.  After that he had severe pain and soreness in bilateral thigh area.  No nausea no vomiting no fever no chills.  No diarrhea no constipation.  No chest pain. His urine was clear. Yesterday he went for another Cross[employer] session.  Prior to session he noticed that his urine was significantly dark in color. After the session his urine continues to remain dark and remain cloudy. And continues to have soreness of bilateral thigh muscles and therefore decided to come to the emergent care.  At the time of my evaluation pain is improving.  Urine is clear.  No nausea no vomiting.  No fever no chills.  No chest pain abdomen. "  Hospital Course:   Summary of his active problems in the hospital  is as following.   1.Rhabdomyolysis-nontrumatic.  CK significantly high. Likely from excessive exertion. Urine is now clear. Treated with aggressive IV hydration. Now trending down.  2.Hypokalemia Corrected. Monitor.  3.  History of migraine Currently requesting to continue ibuprofen.  Monitor renal function.  4.  Elevated LFT Likely in the setting of rhabdomyolysis. Ultrasound abdomen negative.  No right upper quadrant pain.  5.  Diffuse erythema and redness Patient reports having history of chronic idiopathic urticaria for which he was on cholestyramine and prednisone which she stopped taking in May. Rash is actually improving.  Monitor.  Patient was ambulatory without any assistance. On the day of the discharge the patient's vitals were stable, and no other acute medical condition were reported by patient. the patient was felt safe to be discharge at Home with no therapy needed on discharge.  Consultants: none Procedures: none  Discharge Exam: General: Appear in no distress, no Rash; Oral Mucosa Clear, moist. Cardiovascular: S1 and S2 Present, no Murmur, Respiratory: normal respiratory effort, Bilateral Air entry present and no Crackles, no wheezes Abdomen: Bowel Sound present, Soft and no tenderness, no hernia Extremities: no Pedal edema, no calf tenderness Neurology: alert and oriented to time, place, and person affect appropriate.  Filed Weights   11/01/19 0412  Weight: 72.6 kg   Vitals:   11/04/19 2057 11/05/19 0441  BP: 133/78 (!) 142/83  Pulse: 66 67  Resp: 16 16  Temp: 97.7 F (36.5 C) 98.4 F (36.9 C)  SpO2: 97% 97%    DISCHARGE MEDICATION: Allergies as of 11/05/2019      Reactions   Hydrocodone Itching   Imitrex [sumatriptan] Anaphylaxis   Tramadol Other (See Comments)   twitchy   Zonisamide Rash   Amitriptyline Other (See Comments)   Per pt - no mood      Medication List    TAKE these medications   DHEA PO Take 15 mg by mouth at  bedtime.   GLUTATHIONE PO Take 222 mg by mouth every morning.   MAGNESIUM PO Take 1-3 tablets by mouth in the morning and at bedtime. Take 1 tablet in the morning and Take 3 tablets at bedtime   OVER THE COUNTER MEDICATION Take 2 tablets by mouth every morning. Dopa Mucuna   PREGNENOLONE PO Take 75 mg by mouth at bedtime.   testosterone cypionate 200 MG/ML injection Commonly known as: DEPOTESTOSTERONE CYPIONATE Inject 56 mg into the muscle 2 (two) times a week.   vitamin B-12 1000 MCG tablet Commonly known as: CYANOCOBALAMIN Take 1,000 mcg by mouth daily.   Vitamin D3 125 MCG (5000 UT) Tabs Take 1 tablet by mouth daily.   VITAMIN K2 PO Take 150 mcg by mouth daily.      Allergies  Allergen Reactions  . Hydrocodone Itching  . Imitrex [Sumatriptan] Anaphylaxis  . Tramadol Other (See Comments)    twitchy  . Zonisamide Rash  . Amitriptyline Other (See Comments)    Per pt - no mood   Discharge Instructions    Diet - low sodium heart healthy   Complete by: As directed    Discharge instructions   Complete by: As directed    It is important that you read the given instructions as well as go over your medication list with RN to help you understand your care after this hospitalization.  Please follow-up with PCP in 1-2 weeks.  Please note that NO REFILLS for any discharge medications will be authorized once you are discharged, as it is imperative that you return to your primary care physician (or establish a relationship with a primary care physician if you do not have one) for your aftercare needs so that they can reassess your need for medications and monitor your lab values.  Please request your primary care physician to go over all Hospital Tests and Procedure/Radiological results at the follow up. Please get all Hospital records sent to your PCP by signing hospital release before you go home.   Do not take more than prescribed Pain, Sleep and Anxiety Medications.  You  were cared for by a hospitalist during your hospital stay. If you have any questions about your discharge medications or the care you received while you were in the hospital after you are discharged, you can call the unit @UNIT @ you were admitted to and ask to speak with the hospitalist . Ask for Hospitalist on call if the hospitalist that took care of you is not available.   Once you are discharged, your primary care physician will handle any further medical issues.  You Must read complete instructions/literature along with all the possible adverse reactions/side effects for all the Medicines you take and that have been prescribed to you. Take any new Medicines after you have completely understood and accept all the possible adverse reactions/side effects.  If you have smoked or chewed Tobacco in the last 2 yrs please STOP smoking STOP any Recreational drug use.   Increase activity slowly   Complete by: As directed  The results of significant diagnostics from this hospitalization (including imaging, microbiology, ancillary and laboratory) are listed below for reference.    Significant Diagnostic Studies: DG CHEST PORT 1 VIEW  Result Date: 11/03/2019 CLINICAL DATA:  Clinical colo colored urine. EXAM: PORTABLE CHEST 1 VIEW COMPARISON:  07/23/2018 FINDINGS: The heart size is normal. There is no pneumothorax. There is a probable trace right-sided effusion. No significant left-sided pleural effusion. IMPRESSION: Probable trace right-sided pleural effusion. Electronically Signed   By: Katherine Mantle M.D.   On: 11/03/2019 21:41   US Abdomen Limited RUQ  Result Date: 11/02/2019 CLINICAL DATA:  Elevated liver function test. EXAM: ULTRASOUND ABDOMEN LIMITED RIGHT UPPER QUADRANT COMPARISON:  None. FINDINGS: Gallbladder: No gallstones or wall thickening visualized. No sonographic Murphy sign noted by sonographer. Common bile duct: Diameter: 0.2 cm Liver: No focal lesion identified.  Within normal limits in parenchymal echogenicity. Portal vein is patent on color Doppler imaging with normal direction of blood flow towards the liver. Other: None. IMPRESSION: Normal exam. Electronically Signed   By: Drusilla Kanner M.D.   On: 11/02/2019 14:42    Microbiology: No results found for this or any previous visit (from the past 240 hour(s)).   Labs: CBC: Recent Labs  Lab 11/05/19 0553  WBC 7.8  HGB 15.5  HCT 47.2  MCV 91.1  PLT 179   Basic Metabolic Panel: Recent Labs  Lab 11/05/19 0553  NA 140  K 3.7  CL 106  CO2 26  GLUCOSE 89  BUN 11  CREATININE 1.00  CALCIUM 8.8*   Liver Function Tests: Recent Labs  Lab 11/05/19 0553  AST 221*  ALT 148*  ALKPHOS 44  BILITOT 0.9  PROT 6.1*  ALBUMIN 3.5   No results for input(s): LIPASE, AMYLASE in the last 168 hours. No results for input(s): AMMONIA in the last 168 hours. Cardiac Enzymes: Recent Labs  Lab 11/04/19 1620 11/05/19 0553  CKTOTAL 12,284* 8,887*   BNP (last 3 results) No results for input(s): BNP in the last 8760 hours. CBG: No results for input(s): GLUCAP in the last 168 hours.  Time spent: 35 minutes  Signed:  Lynden Oxford  [facility] 11/05/2019 7:32 AM

## 2019-11-22 ENCOUNTER — Other Ambulatory Visit: Payer: Self-pay

## 2019-11-22 ENCOUNTER — Encounter: Payer: Self-pay | Admitting: Family Medicine

## 2019-11-22 ENCOUNTER — Ambulatory Visit (INDEPENDENT_AMBULATORY_CARE_PROVIDER_SITE_OTHER): Payer: Managed Care, Other (non HMO) | Admitting: Family Medicine

## 2019-11-22 VITALS — BP 122/78 | HR 106 | Temp 98.6°F | Ht 70.0 in | Wt 157.4 lb

## 2019-11-22 DIAGNOSIS — M6282 Rhabdomyolysis: Secondary | ICD-10-CM | POA: Diagnosis not present

## 2019-11-22 DIAGNOSIS — R7989 Other specified abnormal findings of blood chemistry: Secondary | ICD-10-CM

## 2019-11-22 DIAGNOSIS — R5383 Other fatigue: Secondary | ICD-10-CM | POA: Diagnosis not present

## 2019-11-22 DIAGNOSIS — E876 Hypokalemia: Secondary | ICD-10-CM

## 2019-11-22 NOTE — Progress Notes (Signed)
Subjective:  Patient ID: Stephen Conner, male    DOB: 05/23/1975  Age: 44 y.o. MRN: [id number]  CC:  Chief Complaint  Patient presents with  . Establish Care  . Hospitalization Follow-up    Rhabdomyolysis    HPI Stephen DelKenneth Conner presents for   New patient to me for follow-up of rhabdomyolysis. Hospital records reviewed. Admitted July 16 through July 20 for nontraumatic rhabdomyolysis.  History of allergic rhinitis, hypokalemia, testosterone deficiency. Few days previously had went for a CrossFit training session, first session, noted to have severe pain and soreness in bilateral thigh area.  2 days later went for another CrossFit session, urine was darker in color at that time.  Persistent dark urine and muscle pain.  Rhabdo thought to be due to excessive exertion, treated with aggressive IV hydration.  Hypokalemia in the hospital was corrected.  Elevated LFTs noted, thought to be in the setting of rhabdo, ultrasound of abdomen was negative.  No right upper quadrant pain. History of chronic idiopathic urticaria, previously treated with prednisone, cholestyramine but rash was improving in hospital.  CPK 15,630 on July 16.  Peak of 16,733 on July 19.  Decreased to 12,284 later that day and 8887 on July 20.  Discharge potassium 3.5 on July 20, level of 3.3 on the 17th.  Creatinine remained normal with discharge creatinine 1.00.  Range of 0.90-1.24.  Peak AST 546, ALT 151, decreased to 221/148 on discharge.   Prior marathon/triathlon/mudrun runner - no prior crossfit.  Has not returned to exercise yet.  Still fatigued. 4 liters water per day.  No dark urine.  No return of myalgias.  No n/v/ab pain. Prior social alcohol - 1-2 drinks per week, none since hospital.  Works from home.   Therapeutic phlebotomy 1pint 4 days ago for elevated hematocrit with testosterone supplementation - [facility].     History Patient Active Problem List   Diagnosis  Date Noted  . 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 Hypokalemia 11/01/2019  . Testosterone deficiency 11/01/2019  . 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 Seasonal and perennial allergic rhinitis 05/24/2016   Past Medical History:  Diagnosis Date  . Asthma    As a child  . Urticaria    Past Surgical History:  Procedure Laterality Date  . SHOULDER SURGERY     Right x 2    Allergies  Allergen Reactions  . Hydrocodone Itching  . Imitrex [Sumatriptan] Anaphylaxis  . Tramadol Other (See Comments)    twitchy  . Zonisamide Rash  . Amitriptyline Other (See Comments)    Per pt - no mood   Prior to Admission medications   Medication Sig Start Date End Date Taking? Authorizing Provider  Cholecalciferol (VITAMIN D3) 125 MCG (5000 UT) TABS Take 1 tablet by mouth daily.   Yes [provider]  GLUTATHIONE PO Take 222 mg by mouth every morning.   Yes [provider]  MAGNESIUM PO Take 1-3 tablets by mouth in the morning and at bedtime. Take 1 tablet in the morning and Take 3 tablets at bedtime   Yes [provider]  Menaquinone-7 (VITAMIN K2 PO) Take 150 mcg by mouth daily.   Yes [provider]  Nutritional Supplements (DHEA PO) Take 15 mg by mouth at bedtime.   Yes [provider]  OVER THE COUNTER MEDICATION Take 2 tablets by mouth every morning. Dopa Mucuna   Yes [provider]  Pregnenolone Micronized (PREGNENOLONE PO) Take 75 mg by mouth at bedtime.  Yes [provider]  testosterone cypionate (DEPOTESTOSTERONE CYPIONATE) 200 MG/ML injection Inject 56 mg into the muscle 2 (two) times a week.  06/04/19  Yes [provider]  vitamin B-12 (CYANOCOBALAMIN) 1000 MCG tablet Take 1,000 mcg by mouth daily.   Yes [provider]   Social History   Socioeconomic History  . Marital status: Married    Spouse name: Not on file  . Number of children: Not on file  . Years of education: Not on file  . Highest education level: Not  on file  Occupational History  . Not on file  Tobacco Use  . Smoking status: Never Smoker  . Smokeless tobacco: Never Used  Vaping Use  . Vaping Use: Never used  Substance and Sexual Activity  . Alcohol use: Yes    Comment: occ  . Drug use: No  . Sexual activity: Not on file  Other Topics Concern  . Not on file  Social History Narrative  . Not on file   Social Determinants of Health   Financial Resource Strain:   . Difficulty of Paying Living Expenses:   Food Insecurity:   . Worried About Programme researcher, broadcasting/film/video in the Last Year:   . Barista in the Last Year:   Transportation Needs:   . Freight forwarder (Medical):   Marland Kitchen Lack of Transportation (Non-Medical):   Physical Activity:   . Days of Exercise per Week:   . Minutes of Exercise per Session:   Stress:   . Feeling of Stress :   Social Connections:   . Frequency of Communication with Friends and Family:   . Frequency of Social Gatherings with Friends and Family:   . Attends Religious Services:   . Active Member of Clubs or Organizations:   . Attends Banker Meetings:   Marland Kitchen Marital Status:   Intimate Partner Violence:   . Fear of Current or Ex-Partner:   . Emotionally Abused:   Marland Kitchen Physically Abused:   . Sexually Abused:     Review of Systems Per hpi  Objective:   Vitals:   11/22/19 1506  BP: 122/78  Pulse: (!) 106  Temp: 98.6 F (37 C)  TempSrc: Temporal  SpO2: 97%  Weight: 157 lb 6.4 oz (71.4 kg)  Height: 5\' 10"  (1.778 m)     Physical Exam Vitals reviewed.  Constitutional:      Appearance: He is well-developed.  HENT:     Head: Normocephalic and atraumatic.  Eyes:     Pupils: Pupils are equal, round, and reactive to light.  Neck:     Vascular: No carotid bruit or JVD.  Cardiovascular:     Rate and Rhythm: Normal rate and regular rhythm.     Heart sounds: Normal heart sounds. No murmur heard.   Pulmonary:     Effort: Pulmonary effort is normal.     Breath sounds:  Normal breath sounds. No rales.  Skin:    General: Skin is warm and dry.  Neurological:     Mental Status: He is alert and oriented to person, place, and time.        Assessment & Plan:  [name] is a 44 y.o. male . Exertional rhabdomyolysis - Plan: Comprehensive metabolic panel, CK  Elevated LFTs - Plan: Comprehensive metabolic panel  Hypokalemia  Fatigue, unspecified type  Stable for continue outpatient monitoring.  Check CPK, LFTs for improvement, continue hydration.  -  With large amount of fluids we will also  check sodium level to make sure he is not hyponatremic with his fatigue.    -Repeat potassium with prior hypokalemia.   - Continue rest from exercise at this time, but when levels are stable can try low intensity walking initially and slow progression with close monitoring of CPK.  -May need to look at his supplements to see if those may have been contributory.  Recheck 1 month and can review other health history at that time.  No orders of the defined types were placed in this encounter.  Patient Instructions     Continue to maintain hydration, avoid return to exercise until CPK and liver tests are normal.  Once those have normalized can start slowly back with some walking, or light jogging with close follow-up of CPK.  Avoid heavy lifting for now.  Let me know if there are questions.  Recheck in 3-4 weeks.    Rhabdomyolysis Rhabdomyolysis is a condition that happens when muscle cells break down and release substances into the blood that can damage the kidneys. This condition happens because of damage to the muscles that move bones (skeletal muscle). When the skeletal muscles are damaged, substances inside the muscle cells go into the blood. One of these substances is a protein called myoglobin. Large amounts of myoglobin can cause kidney damage or kidney failure. Other substances that are released by muscle cells may upset the balance of the minerals  (electrolytes) in your blood. This imbalance causes your blood to have too much acid (acidosis). What are the causes? This condition is caused by muscle damage. Muscle damage often happens because of:  Using your muscles too much.  An injury that crushes or squeezes a muscle too tightly.  Using illegal drugs, mainly cocaine.  Alcohol abuse. Other possible causes include:  Prescription medicines, such as those that: ? Lower cholesterol (statins). ? Treat ADHD (attention deficit hyperactivity disorder) or help with weight loss (amphetamines). ? Treat pain (opiates).  Infections.  Muscle diseases that are passed down from parent to child (inherited).  High fever.  Heatstroke.  Not having enough fluids in your body (dehydration).  Seizures.  Surgery. What increases the risk? This condition is more likely to develop in people who:  Have a family history of muscle disease.  Take part in extreme sports, such as running in marathons.  Have diabetes.  Are older.  Abuse drugs or alcohol. What are the signs or symptoms? Symptoms of this condition vary. Some people have very few symptoms, and other people have many symptoms. The most common symptoms include:  Muscle pain and swelling.  Weak muscles.  Dark urine.  Feeling weak and tired. Other symptoms include:  Nausea and vomiting.  Fever.  Pain in the abdomen.  Pain in the joints. Symptoms of complications from this condition include:  Heart rhythm that is not normal (arrhythmia).  Seizures.  Not urinating enough because of kidney failure.  Very low blood pressure (shock). Signs of shock include dizziness, blurry vision, and clammy skin.  Bleeding that is hard to stop or control. How is this diagnosed? This condition is diagnosed based on your medical history, your symptoms, and a physical exam. Tests may also be done, including:  Blood tests.  Urine tests to check for myoglobin. You may also have  other tests to check for causes of muscle damage and to check for complications. How is this treated? Treatment for this condition helps to:  Make sure you have enough fluids in your body.  Lower the acid  levels in your blood to reverse acidosis.  Protect your kidneys. Treatment may include:  Fluids and medicines given through an IV tube that is inserted into one of your veins.  Medicines to lower acidosis or to bring back the balance of the minerals in your body.  Hemodialysis. This treatment uses an artificial kidney machine to filter your blood while you recover. You may have this if other treatments are not helping. Follow these instructions at home:   Take over-the-counter and prescription medicines only as told by your health care provider.  Rest at home until your health care provider says that you can return to your normal activities.  Drink enough fluid to keep your urine clear or pale yellow.  Do not do activities that take a lot of effort (are strenuous). Ask your health care provider what level of exercise is safe for you.  Do not abuse drugs or alcohol. If you are having problems with drug or alcohol use, ask your health care provider for help.  Keep all follow-up visits as told by your health care provider. This is important. Contact a health care provider if:  You start having symptoms of this condition after treatment. Get help right away if:  You have a seizure.  You bleed easily or cannot control bleeding.  You cannot urinate.  You have chest pain.  You have trouble breathing. This information is not intended to replace advice given to you by your health care provider. Make sure you discuss any questions you have with your health care provider. Document Revised: 03/17/2017 Document Reviewed: 01/15/2016 Elsevier Patient Education  [employer].   If you have lab work done today you will be contacted with your lab results within the next 2 weeks.   If you have not heard from [place] then please contact us. The fastest way to get your results is to register for My Chart.   IF you received an x-ray today, you will receive an invoice from [facility]. Please contact [facility] at [phone] with questions or concerns regarding your invoice.   IF you received labwork today, you will receive an invoice from [facility]. Please contact [facility] at [phone] with questions or concerns regarding your invoice.   Our billing staff will not be able to assist you with questions regarding bills from these companies.  You will be contacted with the lab results as soon as they are available. The fastest way to get your results is to activate your My Chart account. Instructions are located on the last page of this paperwork. If you have not heard from [place] regarding the results in 2 weeks, please contact this office.          Signed, Meredith Staggers, MD [facility]

## 2019-11-22 NOTE — Patient Instructions (Addendum)
Continue to maintain hydration, avoid return to exercise until CPK and liver tests are normal.  Once those have normalized can start slowly back with some walking, or light jogging with close follow-up of CPK.  Avoid heavy lifting for now.  Let me know if there are questions.  Recheck in 3-4 weeks.    Rhabdomyolysis Rhabdomyolysis is a condition that happens when muscle cells break down and release substances into the blood that can damage the kidneys. This condition happens because of damage to the muscles that move bones (skeletal muscle). When the skeletal muscles are damaged, substances inside the muscle cells go into the blood. One of these substances is a protein called myoglobin. Large amounts of myoglobin can cause kidney damage or kidney failure. Other substances that are released by muscle cells may upset the balance of the minerals (electrolytes) in your blood. This imbalance causes your blood to have too much acid (acidosis). What are the causes? This condition is caused by muscle damage. Muscle damage often happens because of:  Using your muscles too much.  An injury that crushes or squeezes a muscle too tightly.  Using illegal drugs, mainly cocaine.  Alcohol abuse. Other possible causes include:  Prescription medicines, such as those that: ? Lower cholesterol (statins). ? Treat ADHD (attention deficit hyperactivity disorder) or help with weight loss (amphetamines). ? Treat pain (opiates).  Infections.  Muscle diseases that are passed down from parent to child (inherited).  High fever.  Heatstroke.  Not having enough fluids in your body (dehydration).  Seizures.  Surgery. What increases the risk? This condition is more likely to develop in people who:  Have a family history of muscle disease.  Take part in extreme sports, such as running in marathons.  Have diabetes.  Are older.  Abuse drugs or alcohol. What are the signs or symptoms? Symptoms of this  condition vary. Some people have very few symptoms, and other people have many symptoms. The most common symptoms include:  Muscle pain and swelling.  Weak muscles.  Dark urine.  Feeling weak and tired. Other symptoms include:  Nausea and vomiting.  Fever.  Pain in the abdomen.  Pain in the joints. Symptoms of complications from this condition include:  Heart rhythm that is not normal (arrhythmia).  Seizures.  Not urinating enough because of kidney failure.  Very low blood pressure (shock). Signs of shock include dizziness, blurry vision, and clammy skin.  Bleeding that is hard to stop or control. How is this diagnosed? This condition is diagnosed based on your medical history, your symptoms, and a physical exam. Tests may also be done, including:  Blood tests.  Urine tests to check for myoglobin. You may also have other tests to check for causes of muscle damage and to check for complications. How is this treated? Treatment for this condition helps to:  Make sure you have enough fluids in your body.  Lower the acid levels in your blood to reverse acidosis.  Protect your kidneys. Treatment may include:  Fluids and medicines given through an IV tube that is inserted into one of your veins.  Medicines to lower acidosis or to bring back the balance of the minerals in your body.  Hemodialysis. This treatment uses an artificial kidney machine to filter your blood while you recover. You may have this if other treatments are not helping. Follow these instructions at home:   Take over-the-counter and prescription medicines only as told by your health care provider.  Rest at home  until your health care provider says that you can return to your normal activities.  Drink enough fluid to keep your urine clear or pale yellow.  Do not do activities that take a lot of effort (are strenuous). Ask your health care provider what level of exercise is safe for you.  Do not  abuse drugs or alcohol. If you are having problems with drug or alcohol use, ask your health care provider for help.  Keep all follow-up visits as told by your health care provider. This is important. Contact a health care provider if:  You start having symptoms of this condition after treatment. Get help right away if:  You have a seizure.  You bleed easily or cannot control bleeding.  You cannot urinate.  You have chest pain.  You have trouble breathing. This information is not intended to replace advice given to you by your health care provider. Make sure you discuss any questions you have with your health care provider. Document Revised: 03/17/2017 Document Reviewed: 01/15/2016 Elsevier Patient Education  [employer].   If you have lab work done today you will be contacted with your lab results within the next 2 weeks.  If you have not heard from [place] then please contact us. The fastest way to get your results is to register for My Chart.   IF you received an x-ray today, you will receive an invoice from [facility]. Please contact [facility] at [phone] with questions or concerns regarding your invoice.   IF you received labwork today, you will receive an invoice from [place]. Please contact [facility] at [phone] with questions or concerns regarding your invoice.   Our billing staff will not be able to assist you with questions regarding bills from these companies.  You will be contacted with the lab results as soon as they are available. The fastest way to get your results is to activate your My Chart account. Instructions are located on the last page of this paperwork. If you have not heard from [place] regarding the results in 2 weeks, please contact this office.

## 2019-11-23 ENCOUNTER — Encounter: Payer: Self-pay | Admitting: Family Medicine

## 2019-11-23 LAB — COMPREHENSIVE METABOLIC PANEL
ALT: 24 IU/L (ref 0–44)
AST: 22 IU/L (ref 0–40)
Albumin/Globulin Ratio: 1.8 (ref 1.2–2.2)
Albumin: 4.8 g/dL (ref 4.0–5.0)
Alkaline Phosphatase: 66 IU/L (ref 48–121)
BUN/Creatinine Ratio: 10 (ref 9–20)
BUN: 13 mg/dL (ref 6–24)
Bilirubin Total: 0.3 mg/dL (ref 0.0–1.2)
CO2: 26 mmol/L (ref 20–29)
Calcium: 9.8 mg/dL (ref 8.7–10.2)
Chloride: 100 mmol/L (ref 96–106)
Creatinine, Ser: 1.28 mg/dL — ABNORMAL HIGH (ref 0.76–1.27)
GFR calc Af Amer: 79 mL/min/{1.73_m2} (ref >59)
GFR calc non Af Amer: 69 mL/min/{1.73_m2} (ref >59)
Globulin, Total: 2.6 g/dL (ref 1.5–4.5)
Glucose: 94 mg/dL (ref 65–99)
Potassium: 4.1 mmol/L (ref 3.5–5.2)
Sodium: 141 mmol/L (ref 134–144)
Total Protein: 7.4 g/dL (ref 6.0–8.5)

## 2019-11-23 LAB — CK: Total CK: 109 U/L (ref 49–439)

## 2019-11-26 ENCOUNTER — Other Ambulatory Visit: Payer: Self-pay | Admitting: Family Medicine

## 2019-11-26 DIAGNOSIS — M6282 Rhabdomyolysis: Secondary | ICD-10-CM

## 2019-11-26 NOTE — Telephone Encounter (Signed)
Addressed in lab results

## 2019-11-26 NOTE — Progress Notes (Signed)
pk

## 2019-12-05 LAB — HM COLONOSCOPY

## 2019-12-08 ENCOUNTER — Encounter: Payer: Self-pay | Admitting: Family Medicine

## 2019-12-09 NOTE — Telephone Encounter (Signed)
Pt found a study on fatigue and low ferritin levels, pt knows you wanted labs done before upcoming 9/2 appt. Asked that you add a ferritin level to this as well. Is this appropriate?

## 2019-12-10 ENCOUNTER — Other Ambulatory Visit: Payer: Self-pay | Admitting: Family Medicine

## 2019-12-10 DIAGNOSIS — R5383 Other fatigue: Secondary | ICD-10-CM

## 2019-12-10 NOTE — Progress Notes (Signed)
See my chart message regarding ferritin.  Will place lab only order for labs prior to next visit.

## 2019-12-13 LAB — FERRITIN: Ferritin: 19 ng/mL — ABNORMAL LOW (ref 30–400)

## 2019-12-19 ENCOUNTER — Other Ambulatory Visit: Payer: Self-pay

## 2019-12-19 ENCOUNTER — Ambulatory Visit (INDEPENDENT_AMBULATORY_CARE_PROVIDER_SITE_OTHER): Payer: Managed Care, Other (non HMO) | Admitting: Family Medicine

## 2019-12-19 ENCOUNTER — Encounter: Payer: Self-pay | Admitting: Family Medicine

## 2019-12-19 VITALS — BP 137/87 | HR 86 | Temp 98.3°F | Ht 71.0 in | Wt 161.0 lb

## 2019-12-19 DIAGNOSIS — Z7189 Other specified counseling: Secondary | ICD-10-CM | POA: Diagnosis not present

## 2019-12-19 DIAGNOSIS — R5383 Other fatigue: Secondary | ICD-10-CM

## 2019-12-19 DIAGNOSIS — M6282 Rhabdomyolysis: Secondary | ICD-10-CM | POA: Diagnosis not present

## 2019-12-19 DIAGNOSIS — Z79890 Hormone replacement therapy (postmenopausal): Secondary | ICD-10-CM | POA: Diagnosis not present

## 2019-12-19 DIAGNOSIS — Z7185 Encounter for immunization safety counseling: Secondary | ICD-10-CM

## 2019-12-19 NOTE — Progress Notes (Signed)
Subjective:  Patient ID: Stephen Conner, male    DOB: 1976-01-31  Age: 44 y.o. MRN: [id number]  CC:  Chief Complaint  Patient presents with  . Follow-up    on Exertional rhabdomyolysis. Pt reports he has followed ptovider advise and increased water intake pt states he drinks 2-3 liters of water a day. pt reports not doing any heavy exercise only short walks.   . Provider FYI    pt had a Colonoscopy on 12/05/2019. By Dr. Cleophus Molt. Pt reports a grad 1 internal hemrroid, but no other findings. no plans to remove some hemroid.pt reports he will start supositories next week.    HPI Stephen Conner presents for   Follow-up exertional/nontraumatic rhabdomyolysis: He was seen to establish care and hospital follow-up on August 6.  Admitted July 16-20.  Noted after [employer] training session.  Treated with aggressive IV hydration.  Hypokalemia corrected in the hospital.  Elevated LFTs in the setting of rhabdo, abdominal ultrasound negative.  LFTs and CK normalized on August 6.  Borderline creatinine 1.28. Some walks with dog, no weigh lifting yet. Some lifting at home, no muscle pains, has not returned to [place] [employer]. 2-3 liters water per day. Usual intake prior to rhabdo.    Has been having some ongoing fatigue since being in hospital with rhabdo in July.  He has been treated with testosterone replacement therapy at [facility] in May of 2020.  and has had to have therapeutic phlebotomy due to elevated hematocrit with the testosterone supplementation.  Most recent phlebotomy 11/18/19 - 1 unit.  Per his records, hemoglobin 16.8 on October 24, 2018, 18.9 February 15, 2019, 18.5 Aug 23, 2019, 17.9 September 18, 2019.   Phlebotomy on March 01, 2019, 1 unit, blood donation of 1 unit on 5 or 10/06/2019, 1 unit phlebotomy July 29, 2019, 1 unit phlebotomy on June 9, and then most recently August 2nd 1 unit. Current dose $RemoveBefo'54mg'kPakuRccLTK$  2 times per week - injection.  testosterone levels: Prior to treatment in  February 2020 testosterone 431, July 20 21,488, October 2020 1492, May 2021 1219, June 2021 1350. Previous testosterone levels May 2018 461, October 2018 526, December 2018 421, May 2019 288, August 2019 363. Based on his symptoms at that time he was started.   Fatigue, no chest pain. After long walk.   Depression screen [facility] 2/9 12/19/2019 11/22/2019  Decreased Interest 0 0  Down, Depressed, Hopeless 0 0  PHQ - 2 Score 0 0   Ferritin was low at 19 on August 26 Lab Results  Component Value Date   WBC 7.8 11/05/2019   HGB 15.5 11/05/2019   HCT 47.2 11/05/2019   MCV 91.1 11/05/2019   PLT 179 11/05/2019   As above he did have a colonoscopy on August 19, hemorrhoids noted, plans home care with suppository.   Considering J and J vaccine.  History Patient Active Problem List   Diagnosis Date Noted  . 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 Hypokalemia 11/01/2019  . Testosterone deficiency 11/01/2019  . 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 Seasonal and perennial allergic rhinitis 05/24/2016   Past Medical History:  Diagnosis Date  . Asthma    As a child  . Urticaria    Past Surgical History:  Procedure Laterality Date  . SHOULDER SURGERY     Right x 2    Allergies  Allergen Reactions  . Hydrocodone Itching  . Imitrex [Sumatriptan] Anaphylaxis  . Tramadol Other (See Comments)    twitchy  . Zonisamide Rash  .  Amitriptyline Other (See Comments)    Per pt - no mood   Prior to Admission medications   Medication Sig Start Date End Date Taking? Authorizing Provider  Cholecalciferol (VITAMIN D3) 125 MCG (5000 UT) TABS Take 1 tablet by mouth daily.   Yes [provider]  GLUTATHIONE PO Take 222 mg by mouth every morning.   Yes [provider]  hydrocortisone (ANUSOL-HC) 25 MG suppository Place rectally. 12/05/19 12/19/19 Yes [provider]  MAGNESIUM PO Take 1-3 tablets by mouth in the morning and at bedtime. Take 1 tablet in the morning and Take 3 tablets at bedtime    Yes [provider]  Menaquinone-7 (VITAMIN K2 PO) Take 150 mcg by mouth daily.   Yes [provider]  Nutritional Supplements (DHEA PO) Take 15 mg by mouth at bedtime.   Yes [provider]  OVER THE COUNTER MEDICATION Take 2 tablets by mouth every morning. Dopa Mucuna   Yes [provider]  Pregnenolone Micronized (PREGNENOLONE PO) Take 75 mg by mouth at bedtime.   Yes [provider]  testosterone cypionate (DEPOTESTOSTERONE CYPIONATE) 200 MG/ML injection Inject 56 mg into the muscle 2 (two) times a week.  06/04/19  Yes [provider]  vitamin B-12 (CYANOCOBALAMIN) 1000 MCG tablet Take 1,000 mcg by mouth daily.   Yes [provider]   Social History   Socioeconomic History  . Marital status: Married    Spouse name: Not on file  . Number of children: Not on file  . Years of education: Not on file  . Highest education level: Not on file  Occupational History  . Not on file  Tobacco Use  . Smoking status: Never Smoker  . Smokeless tobacco: Never Used  Vaping Use  . Vaping Use: Never used  Substance and Sexual Activity  . Alcohol use: Yes    Comment: occ  . Drug use: No  . Sexual activity: Not on file  Other Topics Concern  . Not on file  Social History Narrative  . Not on file   Social Determinants of Health   Financial Resource Strain:   . Difficulty of Paying Living Expenses: Not on file  Food Insecurity:   . Worried About Charity fundraiser in the Last Year: Not on file  . Ran Out of Food in the Last Year: Not on file  Transportation Needs:   . Lack of Transportation (Medical): Not on file  . Lack of Transportation (Non-Medical): Not on file  Physical Activity:   . Days of Exercise per Week: Not on file  . Minutes of Exercise per Session: Not on file  Stress:   . Feeling of Stress : Not on file  Social Connections:   . Frequency of Communication with Friends and Family: Not on file  . Frequency of  Social Gatherings with Friends and Family: Not on file  . Attends Religious Services: Not on file  . Active Member of Clubs or Organizations: Not on file  . Attends Archivist Meetings: Not on file  . Marital Status: Not on file  Intimate Partner Violence:   . Fear of Current or Ex-Partner: Not on file  . Emotionally Abused: Not on file  . Physically Abused: Not on file  . Sexually Abused: Not on file    Review of Systems Per HPI.   Objective:   Vitals:   12/19/19 1027  BP: 137/87  Pulse: 86  Temp: 98.3 F (36.8 C)  TempSrc: Temporal  SpO2: 98%  Weight: 161 lb (73 kg)  Height: $Remove'5\' 11"'UNnSWUm$  (1.803 m)     Physical Exam Vitals reviewed.  Constitutional:      Appearance: He is well-developed.  HENT:     Head: Normocephalic and atraumatic.  Eyes:     Pupils: Pupils are equal, round, and reactive to light.  Neck:     Vascular: No carotid bruit or JVD.  Cardiovascular:     Rate and Rhythm: Normal rate and regular rhythm.     Heart sounds: Normal heart sounds. No murmur heard.   Pulmonary:     Effort: Pulmonary effort is normal.     Breath sounds: Normal breath sounds. No rales.  Skin:    General: Skin is warm and dry.  Neurological:     Mental Status: He is alert and oriented to person, place, and time.    40 minutes spent during visit, greater than 50% counseling and assimilation of information, chart review, and discussion of plan.   Assessment & Plan:  Stephen Conner is a 44 y.o. male . Fatigue, unspecified type - Plan: CBC, Comprehensive metabolic panel, TSH Long-term current use of testosterone replacement therapy  -Check CMP, TSH, CBC with history of fatigue.  Discussed concerns with some of the elevated testosterone levels as well as impact on his CBC.  Recheck next few weeks to discuss further.  Exertional rhabdomyolysis - Plan: CK  -Repeat CPK.  Slowly increase activity/exercise with RTC/ER precautions  Vaccine counseling  -Covid vaccine  recommended, all questions were answered.  Considering [place].  No orders of the defined types were placed in this encounter.  Patient Instructions    As you slowly increase activity, if any new muscle pains be seen immediately. If any dark urine at that time - be seen in ER.   I do have some concerns with your testosterone levels and affects on your blood counts and other possible impacts on your health. I would recommend discussing dosing of meds and risks of that med with your prescribing provider. I am also happy to refer you to endocrinologist for another opinion if you would like.  I will check some labs for fatigue. See info below. Return to discuss further in next 3 weeks.   Please let me know if there are further questions on the Covid vaccine.  Appreciate you coming in today.  Take care.  Fatigue If you have fatigue, you feel tired all the time and have a lack of energy or a lack of motivation. Fatigue may make it difficult to start or complete tasks because of exhaustion. In general, occasional or mild fatigue is often a normal response to activity or life. However, long-lasting (chronic) or extreme fatigue may be a symptom of a medical condition. Follow these instructions at home: General instructions  Watch your fatigue for any changes.  Go to bed and get up at the same time every day.  Avoid fatigue by pacing yourself during the day and getting enough sleep at night.  Maintain a healthy weight. Medicines  Take over-the-counter and prescription medicines only as told by your health care provider.  Take a multivitamin, if told by your health care provider.  Do not use herbal or dietary supplements unless they are approved by your health care provider. Activity   Exercise regularly, as told by your health care provider.  Use or practice techniques to help you relax, such as yoga, tai chi, meditation, or massage therapy. Eating and  drinking  Avoid heavy meals in the evening.  Eat a well-balanced diet, which includes lean proteins, whole grains, plenty of fruits and vegetables, and low-fat dairy products.  Avoid consuming too much caffeine.  Avoid the use of alcohol.  Drink enough fluid to keep your urine pale yellow. Lifestyle  Change situations that cause you stress. Try to keep your work and personal schedule in balance.  Do not use any products that contain nicotine or tobacco, such as cigarettes and e-cigarettes. If you need help quitting, ask your health care provider.  Do not use drugs. Contact a health care provider if:  Your fatigue does not get better.  You have a fever.  You suddenly lose or gain weight.  You have headaches.  You have trouble falling asleep or sleeping through the night.  You feel angry, guilty, anxious, or sad.  You are unable to have a bowel movement (constipation).  Your skin is dry.  You have swelling in your legs or another part of your body. Get help right away if:  You feel confused.  Your vision is blurry.  You feel faint or you pass out.  You have a severe headache.  You have severe pain in your abdomen, your back, or the area between your waist and hips (pelvis).  You have chest pain, shortness of breath, or an irregular or fast heartbeat.  You are unable to urinate, or you urinate less than normal.  You have abnormal bleeding, such as bleeding from the rectum, vagina, nose, lungs, or nipples.  You vomit blood.  You have thoughts about hurting yourself or others. If you ever feel like you may hurt yourself or others, or have thoughts about taking your own life, get help right away. You can go to your nearest emergency department or call:  Your local emergency services (911 in the [place]  A suicide crisis helpline, such as the National Suicide Prevention Lifeline at 214-244-5089. This is open 24 hours a day. Summary  If you have fatigue,  you feel tired all the time and have a lack of energy or a lack of motivation.  Fatigue may make it difficult to start or complete tasks because of exhaustion.  Long-lasting (chronic) or extreme fatigue may be a symptom of a medical condition.  Exercise regularly, as told by your health care provider.  Change situations that cause you stress. Try to keep your work and personal schedule in balance. This information is not intended to replace advice given to you by your health care provider. Make sure you discuss any questions you have with your health care provider. Document Revised: 10/24/2018 Document Reviewed: 12/28/2016 Elsevier Patient Education  [employer].  If you have lab work done today you will be contacted with your lab results within the next 2 weeks.  If you have not heard from [place] then please contact us. The fastest way to get your results is to register for My Chart.   IF you received an x-ray today, you will receive an invoice from [facility]. Please contact [facility] at [phone] with questions or concerns regarding your invoice.   IF you received labwork today, you will receive an invoice from [place]. Please contact [facility] at [phone] with questions or concerns regarding your invoice.   Our billing staff will not be able to assist you with questions regarding bills from these companies.  You will be contacted with the lab results as soon as they are available. The fastest way to get your  results is to activate your My Chart account. Instructions are located on the last page of this paperwork. If you have not heard from [place] regarding the results in 2 weeks, please contact this office.         Signed, Merri Ray, MD [facility]

## 2019-12-19 NOTE — Patient Instructions (Addendum)
As you slowly increase activity, if any new muscle pains be seen immediately. If any dark urine at that time - be seen in ER.   I do have some concerns with your testosterone levels and affects on your blood counts and other possible impacts on your health. I would recommend discussing dosing of meds and risks of that med with your prescribing provider. I am also happy to refer you to endocrinologist for another opinion if you would like.  I will check some labs for fatigue. See info below. Return to discuss further in next 3 weeks.   Please let me know if there are further questions on the Covid vaccine.  Appreciate you coming in today.  Take care.  Fatigue If you have fatigue, you feel tired all the time and have a lack of energy or a lack of motivation. Fatigue may make it difficult to start or complete tasks because of exhaustion. In general, occasional or mild fatigue is often a normal response to activity or life. However, long-lasting (chronic) or extreme fatigue may be a symptom of a medical condition. Follow these instructions at home: General instructions  Watch your fatigue for any changes.  Go to bed and get up at the same time every day.  Avoid fatigue by pacing yourself during the day and getting enough sleep at night.  Maintain a healthy weight. Medicines  Take over-the-counter and prescription medicines only as told by your health care provider.  Take a multivitamin, if told by your health care provider.  Do not use herbal or dietary supplements unless they are approved by your health care provider. Activity   Exercise regularly, as told by your health care provider.  Use or practice techniques to help you relax, such as yoga, tai chi, meditation, or massage therapy. Eating and drinking   Avoid heavy meals in the evening.  Eat a well-balanced diet, which includes lean proteins, whole grains, plenty of fruits and vegetables, and low-fat dairy products.  Avoid  consuming too much caffeine.  Avoid the use of alcohol.  Drink enough fluid to keep your urine pale yellow. Lifestyle  Change situations that cause you stress. Try to keep your work and personal schedule in balance.  Do not use any products that contain nicotine or tobacco, such as cigarettes and e-cigarettes. If you need help quitting, ask your health care provider.  Do not use drugs. Contact a health care provider if:  Your fatigue does not get better.  You have a fever.  You suddenly lose or gain weight.  You have headaches.  You have trouble falling asleep or sleeping through the night.  You feel angry, guilty, anxious, or sad.  You are unable to have a bowel movement (constipation).  Your skin is dry.  You have swelling in your legs or another part of your body. Get help right away if:  You feel confused.  Your vision is blurry.  You feel faint or you pass out.  You have a severe headache.  You have severe pain in your abdomen, your back, or the area between your waist and hips (pelvis).  You have chest pain, shortness of breath, or an irregular or fast heartbeat.  You are unable to urinate, or you urinate less than normal.  You have abnormal bleeding, such as bleeding from the rectum, vagina, nose, lungs, or nipples.  You vomit blood.  You have thoughts about hurting yourself or others. If you ever feel like you may hurt yourself or  others, or have thoughts about taking your own life, get help right away. You can go to your nearest emergency department or call:  Your local emergency services (911 in the [place]  A suicide crisis helpline, such as the [facility] at (941)044-7008. This is open 24 hours a day. Summary  If you have fatigue, you feel tired all the time and have a lack of energy or a lack of motivation.  Fatigue may make it difficult to start or complete tasks because of exhaustion.  Long-lasting (chronic) or  extreme fatigue may be a symptom of a medical condition.  Exercise regularly, as told by your health care provider.  Change situations that cause you stress. Try to keep your work and personal schedule in balance. This information is not intended to replace advice given to you by your health care provider. Make sure you discuss any questions you have with your health care provider. Document Revised: 10/24/2018 Document Reviewed: 12/28/2016 Elsevier Patient Education  [employer].  If you have lab work done today you will be contacted with your lab results within the next 2 weeks.  If you have not heard from [place] then please contact us. The fastest way to get your results is to register for My Chart.   IF you received an x-ray today, you will receive an invoice from [facility]. Please contact [facility]iology at [phone] with questions or concerns regarding your invoice.   IF you received labwork today, you will receive an invoice from [place]. Please contact [facility] at [phone] with questions or concerns regarding your invoice.   Our billing staff will not be able to assist you with questions regarding bills from these companies.  You will be contacted with the lab results as soon as they are available. The fastest way to get your results is to activate your My Chart account. Instructions are located on the last page of this paperwork. If you have not heard from [place] regarding the results in 2 weeks, please contact this office.

## 2019-12-20 LAB — COMPREHENSIVE METABOLIC PANEL
ALT: 17 IU/L (ref 0–44)
AST: 20 IU/L (ref 0–40)
Albumin/Globulin Ratio: 1.5 (ref 1.2–2.2)
Albumin: 4.9 g/dL (ref 4.0–5.0)
Alkaline Phosphatase: 67 IU/L (ref 48–121)
BUN/Creatinine Ratio: 10 (ref 9–20)
BUN: 14 mg/dL (ref 6–24)
Bilirubin Total: 0.6 mg/dL (ref 0.0–1.2)
CO2: 24 mmol/L (ref 20–29)
Calcium: 10 mg/dL (ref 8.7–10.2)
Chloride: 101 mmol/L (ref 96–106)
Creatinine, Ser: 1.41 mg/dL — ABNORMAL HIGH (ref 0.76–1.27)
GFR calc Af Amer: 71 mL/min/{1.73_m2} (ref >59)
GFR calc non Af Amer: 61 mL/min/{1.73_m2} (ref >59)
Globulin, Total: 3.2 g/dL (ref 1.5–4.5)
Glucose: 87 mg/dL (ref 65–99)
Potassium: 4.1 mmol/L (ref 3.5–5.2)
Sodium: 142 mmol/L (ref 134–144)
Total Protein: 8.1 g/dL (ref 6.0–8.5)

## 2019-12-20 LAB — CBC
Hematocrit: 49.9 % (ref 37.5–51.0)
Hemoglobin: 16.6 g/dL (ref 13.0–17.7)
MCH: 29.3 pg (ref 26.6–33.0)
MCHC: 33.3 g/dL (ref 31.5–35.7)
MCV: 88 fL (ref 79–97)
Platelets: 270 10*3/uL (ref 150–450)
RBC: 5.67 x10E6/uL (ref 4.14–5.80)
RDW: 12.6 % (ref 11.6–15.4)
WBC: 5 10*3/uL (ref 3.4–10.8)

## 2019-12-20 LAB — TSH: TSH: 1.17 u[IU]/mL (ref 0.450–4.500)

## 2019-12-20 LAB — CK: Total CK: 162 U/L (ref 49–439)

## 2020-01-09 ENCOUNTER — Encounter: Payer: Self-pay | Admitting: Family Medicine

## 2020-01-09 ENCOUNTER — Other Ambulatory Visit: Payer: Self-pay

## 2020-01-09 ENCOUNTER — Ambulatory Visit (INDEPENDENT_AMBULATORY_CARE_PROVIDER_SITE_OTHER): Payer: Managed Care, Other (non HMO) | Admitting: Family Medicine

## 2020-01-09 VITALS — BP 140/88 | HR 84 | Temp 98.6°F | Ht 71.0 in | Wt 161.0 lb

## 2020-01-09 DIAGNOSIS — R5383 Other fatigue: Secondary | ICD-10-CM | POA: Diagnosis not present

## 2020-01-09 DIAGNOSIS — R251 Tremor, unspecified: Secondary | ICD-10-CM

## 2020-01-09 DIAGNOSIS — R7989 Other specified abnormal findings of blood chemistry: Secondary | ICD-10-CM

## 2020-01-09 LAB — BASIC METABOLIC PANEL
BUN/Creatinine Ratio: 10 (ref 9–20)
BUN: 12 mg/dL (ref 6–24)
CO2: 26 mmol/L (ref 20–29)
Calcium: 9.5 mg/dL (ref 8.7–10.2)
Chloride: 102 mmol/L (ref 96–106)
Creatinine, Ser: 1.19 mg/dL (ref 0.76–1.27)
GFR calc Af Amer: 87 mL/min/{1.73_m2} (ref >59)
GFR calc non Af Amer: 75 mL/min/{1.73_m2} (ref >59)
Glucose: 87 mg/dL (ref 65–99)
Potassium: 4 mmol/L (ref 3.5–5.2)
Sodium: 141 mmol/L (ref 134–144)

## 2020-01-09 NOTE — Patient Instructions (Addendum)
I will repeat kidney test today. If any new muscle aches, let me know and we will check CPK.  I do recommend meeting with movement specialist to evaluate the tremor. Here are a few names - let me know if you would like to see one in particular and I will place that referral:  Dr. Arbutus Leas at [facility] Dr. Frances Furbish at [facility].  Dr. Fidela Juneau, [facility] or Nedra Hai with [facility].   Thanks for coming in today.  Let me know if there are questions, otherwise recheck in 6 weeks and we can review fatigue, potentially repeat blood tests if needed at that time.  Tremor A tremor is trembling or shaking that you cannot control. Most tremors affect the hands or arms. Tremors can also affect the head, vocal cords, face, and other parts of the body. There are many types of tremors. Common types include:  Essential tremor. These usually occur in people older than 40. It may run in families and can happen in otherwise healthy people.  Resting tremor. These occur when the muscles are at rest, such as when your hands are resting in your lap. People with Parkinson's disease often have resting tremors.  Postural tremor. These occur when you try to hold a pose, such as keeping your hands outstretched.  Kinetic tremor. These occur during purposeful movement, such as trying to touch a finger to your nose.  Task-specific tremor. These may occur when you perform certain tasks such as writing, speaking, or standing.  Psychogenic tremor. These dramatically lessen or disappear when you are distracted. They can happen in people of all ages. Some types of tremors have no known cause. Tremors can also be a symptom of nervous system problems (neurological disorders) that may occur with aging. Some tremors go away with treatment, while others do not. Follow these instructions at home: Lifestyle      Limit alcohol intake to no more than 1 drink a day for nonpregnant women and 2 drinks a day for men. One drink equals 12 oz of  beer, 5 oz of wine, or 1 oz of hard liquor.  Do not use any products that contain nicotine or tobacco, such as cigarettes and e-cigarettes. If you need help quitting, ask your health care provider.  Avoid extreme heat and extreme cold.  Limit your caffeine intake, as told by your health care provider.  Try to get 8 hours of sleep each night.  Find ways to manage your stress, such as meditation or yoga. General instructions  Take over-the-counter and prescription medicines only as told by your health care provider.  Keep all follow-up visits as told by your health care provider. This is important. Contact a health care provider if you:  Develop a tremor after starting a new medicine.  Have a tremor along with other symptoms such as: ? Numbness. ? Tingling. ? Pain. ? Weakness.  Notice that your tremor gets worse.  Notice that your tremor interferes with your day-to-day life. Summary  A tremor is trembling or shaking that you cannot control.  Most tremors affect the hands or arms.  Some types of tremors have no known cause. Others may be a symptom of nervous system problems (neurological disorders).  Make sure you discuss any tremors you have with your health care provider. This information is not intended to replace advice given to you by your health care provider. Make sure you discuss any questions you have with your health care provider. Document Revised: 03/17/2017 Document Reviewed: 02/02/2017 Elsevier  Patient Education  [employer].   If you have lab work done today you will be contacted with your lab results within the next 2 weeks.  If you have not heard from [place] then please contact us. The fastest way to get your results is to register for My Chart.   IF you received an x-ray today, you will receive an invoice from [facility]. Please contact [facility] at [phone] with questions or concerns regarding your invoice.   IF you received  labwork today, you will receive an invoice from [place]. Please contact [facility] at [phone] with questions or concerns regarding your invoice.   Our billing staff will not be able to assist you with questions regarding bills from these companies.  You will be contacted with the lab results as soon as they are available. The fastest way to get your results is to activate your My Chart account. Instructions are located on the last page of this paperwork. If you have not heard from [place] regarding the results in 2 weeks, please contact this office.

## 2020-01-09 NOTE — Progress Notes (Signed)
Subjective:  Patient ID: Stephen Conner, male    DOB: 12/20/1975  Age: 44 y.o. MRN: [id number]  CC:  Chief Complaint  Patient presents with   Fatigue    Pt reports he is still feeling fatigued, but it isn't as bad as last OV. pt states he can feel it improve as he eats better. pt reports he has stepped up his activity level since last OV. pt states no other conserns at this time.    HPI Stephen Conner presents for   Fatigue Follow-up from September 2 visit.  Previous exertional rhabdomyolysis in July of with normalization of LFTs and CK on August 6.  Borderline previous creatinine 1.28.  Some ongoing fatigue since his hospitalization in July was discussed at his last visit.  He is under the care of [facility] and has received testosterone supplementation, as well as therapeutic phlebotomy for elevated hematocrit's with use of testosterone.  See details on last visit on readings including some significant elevated testosterone readings.  Recommend he discuss dosing and meds further with his treating provider with option of endocrinology eval.   Does plan on meeting with [facility] about his testosterone, and has lowered his level- 40mg  injection twice per week (from 52mg  dose).  appt 10/20.  CBC, CMP, TSH last visit were reassuring, except borderline creatinine at 1.41. No creatine supplements.  Few small kidney stones in past - no obstructive stones prior.  Creatinine 1.14 on May 24, 2016.  Range of 0.90-1.41 since that time, but has been normal creatinine 0.9-1.24 in July.  Fatigue has improved since last visit, including with improved diet.  He has somewhat increased his activity - walking mile per day, some jogging, no new myalgias. No dark urine.  On 2 liters water per day - typical use, even before rhabdo.  3 cups coffee per day. Still some fatigue, but better.  Has had some action tremor for decades, mom also has this.  His tremor  worsened over past few years. Worse with increased caffeine. No chronic weakness, but occasionally hand drops (every few months). No prior movement specialist eval. Takes dopa mucana supplement past 1.5 yrs - feels like this helps.   Decided to get J [employer] J covid vaccine few weeks ago.   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 Authorizing Provider  Cholecalciferol (VITAMIN D3) 125 MCG (5000 UT) TABS Take 1 tablet by mouth daily.   Yes [provider]  GLUTATHIONE PO Take 222 mg by mouth every morning.   Yes [provider]  MAGNESIUM PO Take 1-3 tablets by mouth in the morning and at bedtime. Take 1 tablet in the morning and Take 3 tablets at bedtime   Yes [provider]  Menaquinone-7 (VITAMIN K2 PO) Take 150 mcg by mouth daily.   Yes [provider]  Nutritional Supplements (DHEA PO) Take 15 mg by mouth at bedtime.   Yes [provider]  OVER THE COUNTER MEDICATION Take 2 tablets by mouth every morning. Dopa Mucuna   Yes [provider]  Pregnenolone Micronized (PREGNENOLONE PO) Take 75 mg by mouth at bedtime.   Yes [provider]  testosterone cypionate (DEPOTESTOSTERONE CYPIONATE) 200 MG/ML injection Inject 56 mg into the muscle 2 (two) times a week.  06/04/19  Yes  [provider]  vitamin B-12 (CYANOCOBALAMIN) 1000 MCG tablet Take 1,000 mcg by mouth daily.   Yes [provider]   Social History   Socioeconomic History   Marital status: Married    Spouse name: Not on file   Number of children: Not on file   Years of education: Not on file   Highest education level: Not on file  Occupational History   Not on file  Tobacco Use   Smoking status: Never Smoker   Smokeless tobacco: Never Used  Vaping Use   Vaping Use: Never used  Substance and Sexual Activity   Alcohol use: Yes    Comment: occ   Drug use: No   Sexual activity: Not on file  Other Topics Concern   Not on file  Social History Narrative   Not on file   Social Determinants of Health   Financial Resource Strain:    Difficulty of Paying Living Expenses: Not on file  Food Insecurity:    Worried About Running Out of Food in the Last Year: Not on file   Ran Out of Food in the Last Year: Not on file  Transportation Needs:    Lack of Transportation (Medical): Not on file   Lack of Transportation (Non-Medical): Not on file  Physical Activity:    Days of Exercise per Week: Not on file   Minutes of Exercise per Session: Not on file  Stress:    Feeling of Stress : Not on file  Social Connections:    Frequency of Communication with Friends and Family: Not on file   Frequency of Social Gatherings with Friends and Family: Not on file   Attends Religious Services: Not on file   Active Member of Clubs or Organizations: Not on file   Attends Banker Meetings: Not on file   Marital Status: Not on file  Intimate Partner Violence:    Fear of Current or Ex-Partner: Not on file   Emotionally Abused: Not on file   Physically Abused: Not on file   Sexually Abused: Not on file    Review of Systems Per hpi.   Objective:   Vitals:   01/09/20 1612 01/09/20 1614  BP: (!) 144/90 140/88  Pulse: 84   Temp: 98.6 F (37 C)     TempSrc: Temporal   SpO2: 97%   Weight: 161 lb (73 kg)   Height: 5\' 11"  (1.803 m)      Physical Exam Vitals reviewed.  Constitutional:      General: He is not in acute distress.    Appearance: Normal appearance. He is well-developed. He is not toxic-appearing.  HENT:     Head: Normocephalic and atraumatic.  Cardiovascular:     Rate and Rhythm: Normal rate.  Pulmonary:     Effort: Pulmonary effort is normal.  Skin:    General: Skin is warm and dry.     Findings: No rash.  Neurological:  General: No focal deficit present.     Mental Status: He is alert and oriented to person, place, and time.     GCS: GCS eye subscore is 4. GCS verbal subscore is 5. GCS motor subscore is 6.     Cranial Nerves: No dysarthria.     Motor: Tremor (No tremor at rest, but noted fine tremor bilateral hands with hands held outwards.) present.     Gait: Gait is intact.  Psychiatric:        Mood and Affect: Mood normal.        Behavior: Behavior normal.    39 minutes spent during visit, greater than 50% counseling and assimilation of information, chart review, and discussion of plan.    Assessment & Plan:  Stephen Conner is a 44 y.o. male . Elevated serum creatinine - Plan: Basic metabolic panel Fatigue, unspecified type  -Still with some fatigue, but is improving with increased activity, and based on diet.  Creatinine has increased slightly from previous level, and levels are higher than when he was in hospital with rhabdo, unlikely AKI.  Doubt supplement related, but may need to look at his supplements further if persistent elevations.  He is to remain well-hydrated.  Does report remote history of nephrolith, but nonobstructive.  May need imaging if persistent elevated creatinine.  Previous urinalysis July 16 ER with large blood on urinalysis with 0-5 RBCs on micro.  Suspect myoglobinuria at time of rhabdo.  -Continue hydration, repeat creatinine today, recheck 6 weeks, potentially sooner if  persistent elevation or increasing creatinine.  Tremor of both hands -Probable essential tremor with family history of same, does report some increasing symptoms past few years.  Some improvement with dopa supplement.  Differential includes other movement disorder including Parkinson's.  Did recommend formal evaluation with movement disorder specialist and some names were provided.  when he is ready for referral I am happy to order.   No orders of the defined types were placed in this encounter.  Patient Instructions    I will repeat kidney test today. If any new muscle aches, let me know and we will check CPK.  I do recommend meeting with movement specialist to evaluate the tremor. Here are a few names - let me know if you would like to see one in particular and I will place that referral:  Dr. Arbutus Leas at [facility] Dr. Frances Furbish at [facility].  Dr. Fidela Juneau, [facility] or Nedra Hai with [facility].   Thanks for coming in today.  Let me know if there are questions, otherwise recheck in 6 weeks and we can review fatigue, potentially repeat blood tests if needed at that time.  Tremor A tremor is trembling or shaking that you cannot control. Most tremors affect the hands or arms. Tremors can also affect the head, vocal cords, face, and other parts of the body. There are many types of tremors. Common types include:  Essential tremor. These usually occur in people older than 40. It may run in families and can happen in otherwise healthy people.  Resting tremor. These occur when the muscles are at rest, such as when your hands are resting in your lap. People with Parkinson's disease often have resting tremors.  Postural tremor. These occur when you try to hold a pose, such as keeping your hands outstretched.  Kinetic tremor. These occur during purposeful movement, such as trying to touch a finger to your nose.  Task-specific tremor. These may occur when you perform certain tasks such as writing,  speaking, or  standing.  Psychogenic tremor. These dramatically lessen or disappear when you are distracted. They can happen in people of all ages. Some types of tremors have no known cause. Tremors can also be a symptom of nervous system problems (neurological disorders) that may occur with aging. Some tremors go away with treatment, while others do not. Follow these instructions at home: Lifestyle      Limit alcohol intake to no more than 1 drink a day for nonpregnant women and 2 drinks a day for men. One drink equals 12 oz of beer, 5 oz of wine, or 1 oz of hard liquor.  Do not use any products that contain nicotine or tobacco, such as cigarettes and e-cigarettes. If you need help quitting, ask your health care provider.  Avoid extreme heat and extreme cold.  Limit your caffeine intake, as told by your health care provider.  Try to get 8 hours of sleep each night.  Find ways to manage your stress, such as meditation or yoga. General instructions  Take over-the-counter and prescription medicines only as told by your health care provider.  Keep all follow-up visits as told by your health care provider. This is important. Contact a health care provider if you:  Develop a tremor after starting a new medicine.  Have a tremor along with other symptoms such as: ? Numbness. ? Tingling. ? Pain. ? Weakness.  Notice that your tremor gets worse.  Notice that your tremor interferes with your day-to-day life. Summary  A tremor is trembling or shaking that you cannot control.  Most tremors affect the hands or arms.  Some types of tremors have no known cause. Others may be a symptom of nervous system problems (neurological disorders).  Make sure you discuss any tremors you have with your health care provider. This information is not intended to replace advice given to you by your health care provider. Make sure you discuss any questions you have with your health care provider. Document Revised:  03/17/2017 Document Reviewed: 02/02/2017 Elsevier Patient Education  [employer].   If you have lab work done today you will be contacted with your lab results within the next 2 weeks.  If you have not heard from [place] then please contact us. The fastest way to get your results is to register for My Chart.   IF you received an x-ray today, you will receive an invoice from [facility]. Please contact [facility] at [phone] with questions or concerns regarding your invoice.   IF you received labwork today, you will receive an invoice from [facility]. Please contact [facility] at [phone] with questions or concerns regarding your invoice.   Our billing staff will not be able to assist you with questions regarding bills from these companies.  You will be contacted with the lab results as soon as they are available. The fastest way to get your results is to activate your My Chart account. Instructions are located on the last page of this paperwork. If you have not heard from [place] regarding the results in 2 weeks, please contact this office.         Signed, Meredith Staggers, MD [facility]

## 2020-02-06 ENCOUNTER — Encounter: Payer: Self-pay | Admitting: Family Medicine

## 2020-02-07 ENCOUNTER — Telehealth: Payer: Self-pay

## 2020-02-07 DIAGNOSIS — R251 Tremor, unspecified: Secondary | ICD-10-CM

## 2020-02-11 NOTE — Telephone Encounter (Signed)
Noted. Thanks.

## 2020-02-11 NOTE — Progress Notes (Signed)
Assessment/Plan:   1.  Essential Tremor  -This is evidenced by the symmetrical nature and longstanding hx of gradually getting worse.  We discussed nature and pathophysiology.  We discussed that this can continue to gradually get worse with time.  We discussed that some medications can worsen this, as can caffeine use.  We discussed medication therapy as well as surgical therapy.  Ultimately, the patient decided to hold on medication therapy.  Discussed with patient that I see no physiologic reason that dopa mucuna would be helpful in patients with essential tremor.  Discussed that levodopa is used in Parkinson's disease, although not generally within this formulation.  2.  Elevated BP  -Could be from the testosterone.  He is weaning off of this.  If blood pressure remains elevated, perhaps a beta-blocker would help, along with helping his tremor.  He and I discussed this today.  However, he does exercise and a beta-blocker may make him feel somewhat fatigued/may make it so he could not get his pulse elevated.  We discussed all of these things today.  He is going to be watching his blood pressure as he is weaning off of the testosterone.  3.  Elevated hemoglobin  -This is the reason that he is trying to wean off of the testosterone primarily.  He recognizes that the testosterone probably caused an elevated H/H and does not want to do anything to harm himself.  4.  F/u prn Subjective:   Stephen Conner was seen in consultation in the movement disorder clinic at the request of Shade Flood, MD.  The evaluation is for tremor.  Prior records made available to me are reviewed, including primary care and hospital records.  Patient is a 44 year old male with a history of admission for rhabdomyolysis in July, status post starting CrossFit training, who presents for evaluation of tremor.  Medical records from primary care reviewed.  Patient also sees integrative medicine, for which he receives  testosterone supplementation and therapeutic phlebotomy for elevated hematocrit.  He is trying to wean off of the testosterone now.  Tremor started in childhood and involves the bilateral UE.  Both hands shake equally.  He is R hand dominant.  Tremor is most noticeable when putting toothpaste on toothbrush.   There is a family hx of tremor in his mother.    Affected by caffeine:  No., unless drinks caffeine on empty stomach Affected by alcohol:  No. Affected by stress:  Yes.   Affected by fatigue:  No. Spills soup if on spoon:  No. Spills glass of liquid if full:  No. Affects ADL's (tying shoes, brushing teeth, etc):  No.  Current/Previously tried tremor medications: Patient on dopa mucuna ("naturally" occurring levodopa) - he thinks that this helps Current medications that may exacerbate tremor:  n/a  Outside reports reviewed: historical medical records, office notes and referral letter/letters.  Allergies  Allergen Reactions  . Hydrocodone Itching  . Imitrex [Sumatriptan] Anaphylaxis  . Tramadol Other (See Comments)    twitchy  . Zonisamide Rash  . Amitriptyline Other (See Comments)    Per pt - no mood    Current Outpatient Medications  Medication Instructions  . ascorbic acid (VITAMIN C) 1,000 mg, Oral, 2 times daily  . Cholecalciferol (VITAMIN D3) 125 MCG (5000 UT) TABS 1 tablet, Oral, Daily  . Ferrous Sulfate (IRON) 90 (18 Fe) MG TABS 2 tablets, Oral, Daily at bedtime  . MAGNESIUM PO 1-3 tablets, Oral, 2 times daily, Take 1 tablet in the morning and  Take 3 tablets at bedtime  . Menaquinone-7 (VITAMIN K2 PO) 150 mcg, Oral, Daily  . Nutritional Supplements (DHEA PO) 15 mg, Oral, Daily at bedtime  . OVER THE COUNTER MEDICATION 2 tablets, Oral, BH-each morning, Dopa Mucuna  . Pregnenolone Micronized (PREGNENOLONE PO) 75 mg, Oral, Daily at bedtime  . testosterone cypionate (DEPOTESTOSTERONE CYPIONATE) 56 mg, Intramuscular, 2 times weekly  . vitamin B-12 (CYANOCOBALAMIN) 1,000  mcg, Oral, Daily     Objective:   VITALS:   Vitals:   02/13/20 0852  BP: (!) 147/93  Pulse: 90  SpO2: 98%  Weight: 163 lb (73.9 kg)  Height: 5\' 10"  (1.778 m)   Gen:  Appears stated age and in NAD. HEENT:  Normocephalic, atraumatic. The mucous membranes are moist. The superficial temporal arteries are without ropiness or tenderness. Cardiovascular: Regular rate and rhythm. Lungs: Clear to auscultation bilaterally. Neck: There are no carotid bruits noted bilaterally.  NEUROLOGICAL:  Orientation:  The patient is alert and oriented x 3.   Cranial nerves: There is good facial symmetry. Extraocular muscles are intact and visual fields are full to confrontational testing. Speech is fluent and clear. Soft palate rises symmetrically and there is no tongue deviation. Hearing is intact to conversational tone. Tone: Tone is good throughout. Sensation: Sensation is intact to light touch touch throughout (facial, trunk, extremities). Vibration is intact at the bilateral big toe. There is no extinction with double simultaneous stimulation. There is no sensory dermatomal level identified. Coordination:  The patient has no dysdiadichokinesia or dysmetria. Motor: Strength is 5/5 in the bilateral upper and lower extremities.  Shoulder shrug is equal bilaterally.  There is no pronator drift.  There are no fasciculations noted. DTR's: Deep tendon reflexes are 2/4 at the bilateral biceps, triceps, brachioradialis, patella and achilles.  Plantar responses are downgoing bilaterally. Gait and Station: The patient is able to ambulate without difficulty. The patient is able to heel toe walk without any difficulty. The patient is able to ambulate in a tandem fashion. The patient is able to stand in the Romberg position.   MOVEMENT EXAM: Tremor:  There is no rest tremor.  There is mild postural and intention tremor.  He has minimal trouble with Archimedes spirals.  Tremor is evident when he pours water from one  glass to another, but he really does not spill the water.  I have reviewed and interpreted the following labs independently   Chemistry      Component Value Date/Time   NA 141 01/09/2020 0000   K 4.0 01/09/2020 0000   CL 102 01/09/2020 0000   CO2 26 01/09/2020 0000   BUN 12 01/09/2020 0000   CREATININE 1.19 01/09/2020 0000   CREATININE 1.14 05/24/2016 1531      Component Value Date/Time   CALCIUM 9.5 01/09/2020 0000   ALKPHOS 67 12/19/2019 1204   AST 20 12/19/2019 1204   ALT 17 12/19/2019 1204   BILITOT 0.6 12/19/2019 1204      Lab Results  Component Value Date   WBC 5.0 12/19/2019   HGB 16.6 12/19/2019   HCT 49.9 12/19/2019   MCV 88 12/19/2019   PLT 270 12/19/2019   Lab Results  Component Value Date   TSH 1.170 12/19/2019      CC:  02/18/2020, MD

## 2020-02-11 NOTE — Telephone Encounter (Signed)
After initially sending request to provider I circled back to check status, looked further found where Dr Neva Seat had stated he wanted this ordered, I ordered referral rather than sending repeat message. Ambulatory referral to Neurology placed today with cosign pt to be scheduled next two weeks

## 2020-02-13 ENCOUNTER — Ambulatory Visit (INDEPENDENT_AMBULATORY_CARE_PROVIDER_SITE_OTHER): Payer: Managed Care, Other (non HMO) | Admitting: Neurology

## 2020-02-13 ENCOUNTER — Encounter: Payer: Self-pay | Admitting: Neurology

## 2020-02-13 ENCOUNTER — Other Ambulatory Visit: Payer: Self-pay

## 2020-02-13 VITALS — BP 147/93 | HR 90 | Ht 70.0 in | Wt 163.0 lb

## 2020-02-13 DIAGNOSIS — G250 Essential tremor: Secondary | ICD-10-CM

## 2020-02-13 NOTE — Patient Instructions (Signed)

## 2020-02-27 ENCOUNTER — Encounter: Payer: Self-pay | Admitting: Family Medicine

## 2020-02-27 ENCOUNTER — Ambulatory Visit (INDEPENDENT_AMBULATORY_CARE_PROVIDER_SITE_OTHER): Payer: Managed Care, Other (non HMO) | Admitting: Family Medicine

## 2020-02-27 ENCOUNTER — Other Ambulatory Visit: Payer: Self-pay

## 2020-02-27 VITALS — BP 138/88 | HR 71 | Temp 98.2°F | Resp 15 | Ht 70.0 in | Wt 161.8 lb

## 2020-02-27 DIAGNOSIS — R251 Tremor, unspecified: Secondary | ICD-10-CM

## 2020-02-27 DIAGNOSIS — M25539 Pain in unspecified wrist: Secondary | ICD-10-CM

## 2020-02-27 DIAGNOSIS — R200 Anesthesia of skin: Secondary | ICD-10-CM

## 2020-02-27 DIAGNOSIS — R5383 Other fatigue: Secondary | ICD-10-CM | POA: Diagnosis not present

## 2020-02-27 NOTE — Progress Notes (Signed)
Subjective:  Patient ID: Stephen Conner, male    DOB: [birth year]  Age: 44 y.o. MRN: [id number]  CC:  Chief Complaint  Patient presents with  . Fatigue    pt has been feeling fatigued recently due to "TRT" pt has been trying to balance his lab work     HPI Stephen Conner presents for   Fatigue:g   See prior visits.   Followed by [facility], treated with testosterone replacement, has received therapeutic phlebotomy for elevated HCT. Prior elevated testosterone readings.  He has lowered his dose in 9/23 visit to 40mg  injection twice per week (prior 52mg  dose).  Fatigue improving last visit. Prior CBC, CMP, TSH ok. Creatinine had normalized. Prior exertional rhabdo with normal CPK past few visits and tolerating return of exercise.  Action tremor discussed last visit - taking dopa mucana supplement. Discussed movement d/o specialist referral. Saw Dr. on 10/28. Diagnosed with essential tremor. No meds initially. Option of betablocker.   Saw [facility] integr[facility] 10/20. Testosterone 823 on 40mg  dosing twice per week. HGB had increased to 17.5 on 01/24/20. It was decided to stop testosterone- weaned to off 3 days ago- last injection - 12mg  testosterone (0.45ml) 3 days ago.  Fatigue better with iron supplement - started on iron supplement and vitamin C on 10/20 as ferritin was low - 19 on 8/26, and level of 20 on October 8th. No other iron testing.  Taking 36mg  iron per night. Blood draw for [facility] next week with follow up Nov 30th. Still tired at end of day. Started on Clomid when stopped testosterone to restart his own testosterone production few weeks ago.   Forearm soreness off and on since February. Both hands go numb at times at night.  Sometimes 4th-5th fingers, sometimes whole hand.  Typing all day. Numbness and tension relieved with straightening arms. Resolves during the day. Has slept with arms tucked in - tries to keep out, but end up with arms tucked  in.  Still some walking for exercise. No recurrence of rhabdo symptoms. Typing more on weekends now.  No cycling.   Lab Results  Component Value Date   CKTOTAL 162 12/19/2019     Lab Results  Component Value Date   CREATININE 1.19 01/09/2020     History Patient Active Problem List   Diagnosis Date Noted  . 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 Hypokalemia 11/01/2019  . Testosterone deficiency 11/01/2019  . 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 Seasonal and perennial allergic rhinitis 05/24/2016   Past Medical History:  Diagnosis Date  . Asthma    As a child  . Migraine   . Rhabdomyolysis   . Urticaria    Past Surgical History:  Procedure Laterality Date  . SHOULDER SURGERY     Right x 2    Allergies  Allergen Reactions  . Hydrocodone Itching  . Imitrex [Sumatriptan] Anaphylaxis  . Tramadol Other (See Comments)    twitchy  . Zonisamide Rash  . Amitriptyline Other (See Comments)    Per pt - no mood   Prior to Admission medications   Medication Sig Start Date End Date Taking? Authorizing Provider  ascorbic acid (VITAMIN C) 500 MG tablet Take 1,000 mg by mouth 2 (two) times daily.   Yes [provider]  Cholecalciferol (VITAMIN D3) 125 MCG (5000 UT) TABS Take 1 tablet by mouth daily.   Yes [provider]  clomiPHENE (CLOMID) 50 MG tablet Take 25 mg by mouth daily. Pt takes 1 half tablet daily  Yes [provider]  Ferrous Sulfate (IRON) 90 (18 Fe) MG TABS Take 2 tablets by mouth at bedtime.   Yes [provider]  MAGNESIUM PO Take 1-3 tablets by mouth in the morning and at bedtime. Take 1 tablet in the morning and Take 3 tablets at bedtime   Yes [provider]  Menaquinone-7 (VITAMIN K2 PO) Take 150 mcg by mouth daily.   Yes [provider]  Nutritional Supplements (DHEA PO) Take 15 mg by mouth at bedtime.   Yes [provider]  Pregnenolone Micronized (PREGNENOLONE PO) Take 75 mg by mouth at bedtime.   Yes  [provider]  vitamin B-12 (CYANOCOBALAMIN) 1000 MCG tablet Take 1,000 mcg by mouth daily.   Yes [provider]   Social History   Socioeconomic History  . Marital status: Married    Spouse name: Not on file  . Number of children: Not on file  . Years of education: Not on file  . Highest education level: Not on file  Occupational History  . Not on file  Tobacco Use  . Smoking status: Never Smoker  . Smokeless tobacco: Never Used  Vaping Use  . Vaping Use: Never used  Substance and Sexual Activity  . Alcohol use: Yes    Alcohol/week: 1.0 standard drink    Types: 1 Cans of beer per week  . Drug use: No  . Sexual activity: Not on file  Other Topics Concern  . Not on file  Social History Narrative  . Not on file   Social Determinants of Health   Financial Resource Strain:   . Difficulty of Paying Living Expenses: Not on file  Food Insecurity:   . Worried About Programme researcher, broadcasting/film/videounning Out of Food in the Last Year: Not on file  . Ran Out of Food in the Last Year: Not on file  Transportation Needs:   . Lack of Transportation (Medical): Not on file  . Lack of Transportation (Non-Medical): Not on file  Physical Activity:   . Days of Exercise per Week: Not on file  . Minutes of Exercise per Session: Not on file  Stress:   . Feeling of Stress : Not on file  Social Connections:   . Frequency of Communication with Friends and Family: Not on file  . Frequency of Social Gatherings with Friends and Family: Not on file  . Attends Religious Services: Not on file  . Active Member of Clubs or Organizations: Not on file  . Attends BankerClub or Organization Meetings: Not on file  . Marital Status: Not on file  Intimate Partner Violence:   . Fear of Current or Ex-Partner: Not on file  . Emotionally Abused: Not on file  . Physically Abused: Not on file  . Sexually Abused: Not on file    Review of Systems   Objective:   Vitals:   02/27/20 1625  BP: 138/88  Pulse: 71  Resp:  15  Temp: 98.2 F (36.8 C)  TempSrc: Temporal  SpO2: 96%  Weight: 161 lb 12.8 oz (73.4 kg)  Height: 5\' 10"  (1.778 m)     Physical Exam Vitals reviewed.  Constitutional:      General: He is not in acute distress.    Appearance: He is well-developed. He is not ill-appearing.  HENT:     Head: Normocephalic and atraumatic.  Cardiovascular:     Rate and Rhythm: Normal rate.  Pulmonary:     Effort: Pulmonary effort is normal.  Musculoskeletal:  Comments: Slight tenderness of the upper flexor musculature of the forearm, but soft, no apparent swelling.  Full range of motion of wrist, hands, fingers.  No soft tissue swelling.  Neurovascular intact distally.  Faint tremor.  Negative Phalen, negative Tinel's, negative Tinel's over [place].  Skin:    General: Skin is warm and dry.  Neurological:     Mental Status: He is alert and oriented to person, place, and time.       38 minutes spent during visit, greater than 50% counseling and assimilation of information, chart review, and discussion of plan.   Assessment & Plan:  Stephen Conner is a 44 y.o. male . Fatigue, unspecified type  -Possibly multifactorial, but is improving with iron supplementation, previous low ferritin.  Under care of [facility].  Now off testosterone with plan to repeat labs next few weeks as above.  Option of hematology eval if persistent erythrocytosis.  Option of endocrine eval if further questions regarding hormone treatment, but is under the care of [facility] as above.  Hand numbness Arthralgia of forearm, unspecified laterality  -Possible overuse with frequent typing, possible ulnar nerve impingement given ulnar distribution of symptoms but also has reported some dysesthesias into other parts of his hands.  Negative Phalen's, Tinel's.  Negative testing at [place] at this time.  Option of hand specialist evaluation, but can try decreased typing, other  techniques for minimizing constriction of wrist/elbows at night in case he is having more proximal ulnar neuropathy.  RTC precautions if persistent myalgias given previous rhabdo.  Unlikely cause at this time.   Tremor of both hands -Option of beta-blocker, deferred at this time.  No orders of the defined types were placed in this encounter.  Patient Instructions   If persistent low ferritin, and hemoglobin remains elevated, I do recommend follow up with hematologist.   For tremor- beta blocker is an option - follow up with Dr. Arbutus Leas as needed.   For hand/arm symptoms - could be related to typing. If possible, look at ways to decrease typing.  Can try setting up workstation differently, loosely wrapping towel to keep arms from bending at night, but if not helping would recommend hand specialist evaluation. Let me know if you would like to meet with Dr. Janee Morn.    Return to the clinic or go to the nearest emergency room if any of your symptoms worsen or new symptoms occur including andy persistent muscle aches.    How to set up your workstation Key Points:  Chair:  An adjustable chair to fit your height with a slight downward tilt of chair seat is helpful.  Should have good high-back support that assists with keeping the natural curves of your spine.  A "lumbar roll" or pillow at your low back can help you keep good posture.  Adjustable arm rests may decrease strain in upper body.   Keyboard:  Should be close and at elbow or just below elbow height.  Split keyboards can help decrease strain on wrist.  Wrist supports can provide mini-breaks to your wrists throughout the day.  Monitor/Screen:  Top of screen should be at eye level or slightly lower.  Good lighting is important to prevent eye strain, or headaches from glare.  Head Posture: A copy holder on either side of the monitor will help limit neck strain.  Head should not be forward.  Ears should line up with you shoulders.  Leg  Posture:  Knee and hips should be bent  half-way (about a 90 degree angle).  Shorter people may need something to prop their feet on, with knees bent, like a phone book.  Other Stuff: Keep the stuff (phones, pens, phonebooks, etc) that you use frequently close to you, so you're not straining to reach them.   If you have lab work done today you will be contacted with your lab results within the next 2 weeks.  If you have not heard from [place] then please contact us. The fastest way to get your results is to register for My Chart.   IF you received an x-ray today, you will receive an invoice from [facility]. Please contact [facility] at [phone] with questions or concerns regarding your invoice.   IF you received labwork today, you will receive an invoice from [place]. Please contact [facility] at [phone] with questions or concerns regarding your invoice.   Our billing staff will not be able to assist you with questions regarding bills from these companies.  You will be contacted with the lab results as soon as they are available. The fastest way to get your results is to activate your My Chart account. Instructions are located on the last page of this paperwork. If you have not heard from [place] regarding the results in 2 weeks, please contact this office.         Signed, Meredith Staggers, MD [facility]

## 2020-02-27 NOTE — Patient Instructions (Addendum)
If persistent low ferritin, and hemoglobin remains elevated, I do recommend follow up with hematologist.   For tremor- beta blocker is an option - follow up with Dr. Arbutus Leas as needed.   For hand/arm symptoms - could be related to typing. If possible, look at ways to decrease typing.  Can try setting up workstation differently, loosely wrapping towel to keep arms from bending at night, but if not helping would recommend hand specialist evaluation. Let me know if you would like to meet with Dr. Janee Morn.    Return to the clinic or go to the nearest emergency room if any of your symptoms worsen or new symptoms occur including andy persistent muscle aches.    How to set up your workstation Key Points:  Chair:  An adjustable chair to fit your height with a slight downward tilt of chair seat is helpful.  Should have good high-back support that assists with keeping the natural curves of your spine.  A "lumbar roll" or pillow at your low back can help you keep good posture.  Adjustable arm rests may decrease strain in upper body.   Keyboard:  Should be close and at elbow or just below elbow height.  Split keyboards can help decrease strain on wrist.  Wrist supports can provide mini-breaks to your wrists throughout the day.  Monitor/Screen:  Top of screen should be at eye level or slightly lower.  Good lighting is important to prevent eye strain, or headaches from glare.  Head Posture: A copy holder on either side of the monitor will help limit neck strain.  Head should not be forward.  Ears should line up with you shoulders.  Leg Posture:  Knee and hips should be bent half-way (about a 90 degree angle).  Shorter people may need something to prop their feet on, with knees bent, like a phone book.  Other Stuff: Keep the stuff (phones, pens, phonebooks, etc) that you use frequently close to you, so you're not straining to reach them.   If you have lab work done today you will be contacted with your  lab results within the next 2 weeks.  If you have not heard from [place] then please contact us. The fastest way to get your results is to register for My Chart.   IF you received an x-ray today, you will receive an invoice from [facility]. Please contact [facility] at [phone] with questions or concerns regarding your invoice.   IF you received labwork today, you will receive an invoice from [place]. Please contact [facility] at [phone] with questions or concerns regarding your invoice.   Our billing staff will not be able to assist you with questions regarding bills from these companies.  You will be contacted with the lab results as soon as they are available. The fastest way to get your results is to activate your My Chart account. Instructions are located on the last page of this paperwork. If you have not heard from [place] regarding the results in 2 weeks, please contact this office.

## 2020-02-28 ENCOUNTER — Encounter: Payer: Self-pay | Admitting: Family Medicine

## 2020-02-28 DIAGNOSIS — Z8739 Personal history of other diseases of the musculoskeletal system and connective tissue: Secondary | ICD-10-CM

## 2020-02-28 DIAGNOSIS — M25539 Pain in unspecified wrist: Secondary | ICD-10-CM

## 2020-03-03 NOTE — Telephone Encounter (Signed)
Pt requesting referral to see Dr Mack Hook about forearms that continue to be sore

## 2020-05-06 NOTE — Progress Notes (Signed)
Virtual Visit Via Video   The purpose of this virtual visit is to provide medical care while limiting exposure to the novel coronavirus.    Consent was obtained for video visit:  Yes.   Answered questions that patient had about telehealth interaction:  Yes.   I discussed the limitations, risks, security and privacy concerns of performing an evaluation and management service by telemedicine. I also discussed with the patient that there may be a patient responsible charge related to this service. The patient expressed understanding and agreed to proceed.  Pt location: Home Physician Location: office Name of referring provider:  Shade Flood, MD I connected with Stephen Conner at patients initiation/request on 05/08/2020 at  2:30 PM EST by video enabled telemedicine application and verified that I am speaking with the correct person using two identifiers. Pt MRN:  [id number] Pt DOB:  [birth year] Video Participants:  Stephen Conner;    Assessment/Plan:   1.  tremor, likely essential but enhanced physiologic is certainly in the differential  -Patient would like to try very low-dose gabapentin.  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  He knows that gabapentin is generally a second line medication, but also knows that we do use it for tremor.  He wants to try low-dose and see what happens.  We will start gabapentin, 100 mg, 2 tablets in the morning and 1 tablet at night.  I told him to give it a few weeks and see how he did.  We can increase the dose if needed.  R/b/se discussed.  -We did discuss potentially using a beta-blocker last visit, as his blood pressure was quite high.  The patient states that he really has not checked or followed up in that regard.  He really would like to start with gabapentin and see how he does.  I think he worries about exercise tolerability with beta-blocker therapy, which is understandable,  although he would not have that issue with primidone.  Nonetheless, we will see how he does. Subjective   Patient seen today in follow-up for essential tremor.  When I saw him in October, the patient was on dopa mucuna, and I discussed with him there is no physiologic reason that this would be helpful in patients with essential tremor (he had no evidence of Parkinson's disease).  He wanted no treatment for the essential tremor.  He thought that the supplement was helpful.  He was told to follow-up on an as-needed basis.  We did note last time that he had elevated blood pressure, but told him it could be from his testosterone supplements, which he was weaning off, but suggested perhaps beta-blocker therapy would be helpful for that along with tremor.  Records from primary care from November reviewed.  Patient seeing integrative medicine and had started on Clomid when stopped testosterone.  States that he is off of clomid.  Weaning iron.  Tried to restart dopa mucuna but agreed that not helpful for tremor.  States that he tried to limit caffeine, but "I am not going to give up coffee."  States that he has been following with orthopedics for arm and hand pain and was diagnosed with what sounds like ulnar neuropathy.  He states that he asked the surgeon about neck steps, and they mentioned gabapentin.  He asks me if potentially we could use gabapentin for both the arm pain as well as the tremor.  Current Outpatient Medications on File Prior to Visit  Medication Sig Dispense Refill   ascorbic acid (VITAMIN C) 500 MG tablet Take 1,000 mg by mouth 2 (two) times daily.     Cholecalciferol (VITAMIN D3) 125 MCG (5000 UT) TABS Take 1 tablet by mouth daily.     clomiPHENE (CLOMID) 50 MG tablet Take 25 mg by mouth daily. Pt takes 1 half tablet daily     Ferrous Sulfate (IRON) 90 (18 Fe) MG TABS Take 2 tablets by mouth at bedtime.     MAGNESIUM PO Take 1-3 tablets by mouth in the morning and at bedtime. Take  1 tablet in the morning and Take 3 tablets at bedtime     meloxicam (MOBIC) 15 MG tablet Take 15 mg by mouth daily. Take one tablet every other day     Menaquinone-7 (VITAMIN K2 PO) Take 150 mcg by mouth daily.     Nutritional Supplements (DHEA PO) Take 15 mg by mouth at bedtime.     Pregnenolone Micronized (PREGNENOLONE PO) Take 75 mg by mouth at bedtime.     vitamin B-12 (CYANOCOBALAMIN) 1000 MCG tablet Take 1,000 mcg by mouth daily.     No current facility-administered medications on file prior to visit.     Objective   There were no vitals filed for this visit. GEN:  The patient appears stated age and is in NAD.  Neurological examination:  Orientation: The patient is alert and oriented x3. Cranial nerves: There is good facial symmetry. There is no facial hypomimia.  The speech is fluent and clear. Soft palate rises symmetrically and there is no tongue deviation. Hearing is intact to conversational tone. Motor: Strength is at least antigravity x 4.   Shoulder shrug is equal and symmetric.  There is no pronator drift.  Movement examination: Tone: unable Abnormal movements: There is fine tremor of the outstretched hands.  He has fine tremor with Archimedes spirals. Gait and Station: The patient has no difficulty arising out of a deep-seated chair without the use of the hands. The patient is able to ambulate in a tandem fashion.  He is able to stand in the Romberg position with eyes closed.    Follow up Instructions      -I discussed the assessment and treatment plan with the patient. The patient was provided an opportunity to ask questions and all were answered. The patient agreed with the plan and demonstrated an understanding of the instructions.   The patient was advised to call back or seek an in-person evaluation if the symptoms worsen or if the condition fails to improve as anticipated.    Total time spent on today's visit was , including both face-to-face  time and nonface-to-face time.  Time included that spent on review of records (prior notes available to me/labs/imaging if pertinent), discussing treatment and goals, answering patient's questions and coordinating care.   Kerin Salen, DO

## 2020-05-08 ENCOUNTER — Telehealth (INDEPENDENT_AMBULATORY_CARE_PROVIDER_SITE_OTHER): Payer: Managed Care, Other (non HMO) | Admitting: Neurology

## 2020-05-08 ENCOUNTER — Other Ambulatory Visit: Payer: Self-pay

## 2020-05-08 DIAGNOSIS — G250 Essential tremor: Secondary | ICD-10-CM

## 2020-05-08 MED ORDER — GABAPENTIN 100 MG PO CAPS: ORAL_CAPSULE | ORAL | 1 refills | Status: DC

## 2020-05-28 ENCOUNTER — Ambulatory Visit: Payer: Managed Care, Other (non HMO) | Admitting: Family Medicine

## 2020-06-24 ENCOUNTER — Ambulatory Visit (INDEPENDENT_AMBULATORY_CARE_PROVIDER_SITE_OTHER): Payer: Managed Care, Other (non HMO) | Admitting: Family Medicine

## 2020-06-24 ENCOUNTER — Other Ambulatory Visit: Payer: Self-pay

## 2020-06-24 ENCOUNTER — Encounter: Payer: Self-pay | Admitting: Family Medicine

## 2020-06-24 VITALS — BP 144/72 | HR 66 | Temp 98.3°F | Ht 70.0 in | Wt 165.0 lb

## 2020-06-24 DIAGNOSIS — R251 Tremor, unspecified: Secondary | ICD-10-CM

## 2020-06-24 DIAGNOSIS — R200 Anesthesia of skin: Secondary | ICD-10-CM | POA: Diagnosis not present

## 2020-06-24 DIAGNOSIS — R5383 Other fatigue: Secondary | ICD-10-CM | POA: Diagnosis not present

## 2020-06-24 DIAGNOSIS — R030 Elevated blood-pressure reading, without diagnosis of hypertension: Secondary | ICD-10-CM

## 2020-06-24 DIAGNOSIS — R7989 Other specified abnormal findings of blood chemistry: Secondary | ICD-10-CM

## 2020-06-24 NOTE — Patient Instructions (Addendum)
Call Dr. Arbutus Leas about med dose changes. I am glad to hear you are feeling better.   Blood pressure has been running a little high. I would consider a low dose of amlodipine if that remains over 130/80 - let me know about your home readings.   Have your kidney test rechecked in next few months - we can check that or can be checked at [facility].   Thanks for coming in today.   If you have lab work done today you will be contacted with your lab results within the next 2 weeks.  If you have not heard from [place] then please contact us. The fastest way to get your results is to register for My Chart.   IF you received an x-ray today, you will receive an invoice from [facility]. Please contact [facility] at [phone] with questions or concerns regarding your invoice.   IF you received labwork today, you will receive an invoice from [place]. Please contact [facility] at [phone] with questions or concerns regarding your invoice.   Our billing staff will not be able to assist you with questions regarding bills from these companies.  You will be contacted with the lab results as soon as they are available. The fastest way to get your results is to activate your My Chart account. Instructions are located on the last page of this paperwork. If you have not heard from [place] regarding the results in 2 weeks, please contact this office.

## 2020-06-24 NOTE — Progress Notes (Signed)
Subjective:  Patient ID: Stephen Conner, male    DOB: 1976/03/21  Age: 45 y.o. MRN: [id number]  CC:  Chief Complaint  Patient presents with  . Follow-up    On fatigue. PT reports this issue has improved since last OV. PT reports no issues with current medication. PT reports no change in diet, but has been more active since last OV.    HPI Stephen Conner presents for   Fatigue Follow-up from November.  Followed by [facility].  On testosterone replacement, has received therapeutic phlebotomy for elevated hematocrit.  Did have history of low ferritin, was taking 36 mg iron per night at last visit. As of last visit he had weaned off testosterone and started on Clomid from [facility].  Dysesthesias in arms discussed last visit.  Possible overuse component with typing, option of hand specialist eval if persistent.  Previous rhabdo without recurrence.  Also has been diagnosed with essential tremor by neurology.  Option of beta-blocker Slight elevated creatinine at 1.41 on September 2, repeat labs September 23 normalized. Fatigue has improved.  Off testosterone and clomid - on some supplements still from [facility].  Off iron. Had normal ferritin at [facility]. Creatinine mildly elevated.   Had follow-up with orthopedics, Dr. 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 tx with prednisone, then meloxicam, uses arm sleeves to keep from bending arms - now only on ibuprofen as needed - doing better.   Visit with neurology January 21 noted, started on gabapentin 100 mg nightly, 200 mg every morning. Helping a little, but still some tremor - plans to discuss med increases with Dr. Arbutus Leas.   Lab Results  Component Value Date   CREATININE 1.19 01/09/2020    Lab Results  Component Value Date   FERRITIN 19 (L) 12/12/2019   Lab Results  Component Value Date   WBC 5.0 12/19/2019   HGB 16.6 12/19/2019   HCT 49.9 12/19/2019   MCV 88 12/19/2019   PLT 270  12/19/2019   Elevated blood pressure BP 128/70's at [facility].  No home readings, no meds.  Alcohol - occasional - 1-2 beers/week.  Min added salt.  ? Borderline elevated creatinine at [facility] 1 month ago. 1.29 on 05/28/20. Next bloodwork there in 3-4 months.  AST, ALT normal on recent labs  Increasing physical activity, and low intensity exercise plan at home.   BP Readings from Last 3 Encounters:  06/24/20 (!) 144/72  02/27/20 138/88  02/13/20 (!) 147/93     History Patient Active Problem List   Diagnosis Date Noted  . 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 Hypokalemia 11/01/2019  . Testosterone deficiency 11/01/2019  . 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 Seasonal and perennial allergic rhinitis 05/24/2016   Past Medical History:  Diagnosis Date  . Asthma    As a child  . Migraine   . Rhabdomyolysis   . Urticaria    Past Surgical History:  Procedure Laterality Date  . SHOULDER SURGERY     Right x 2    Allergies  Allergen Reactions  . Hydrocodone Itching  . Imitrex [Sumatriptan] Anaphylaxis  . Tramadol Other (See Comments)    twitchy  . Zonisamide Rash  . Amitriptyline Other (See Comments)    Per pt - no mood   Prior to Admission medications   Medication Sig Start Date End Date Taking? Authorizing Provider  ascorbic acid (VITAMIN C) 500 MG tablet Take 1,000 mg by mouth 2 (two) times daily.   Yes [provider]  Cholecalciferol (VITAMIN D3)  125 MCG (5000 UT) TABS Take 1 tablet by mouth daily.   Yes [provider]  gabapentin (NEURONTIN) 100 MG capsule 2 q AM, 1 q hs 05/08/20  Yes Tat, Rebecca S, DO  MAGNESIUM PO Take 1-3 tablets by mouth in the morning and at bedtime. Take 1 tablet in the morning and Take 3 tablets at bedtime   Yes [provider]  Menaquinone-7 (VITAMIN K2 PO) Take 150 mcg by mouth daily.   Yes [provider]  Nutritional Supplements (DHEA PO) Take 15 mg by mouth at bedtime.   Yes [provider]   Pregnenolone Micronized (PREGNENOLONE PO) Take 150 mg by mouth daily.   Yes [provider]  vitamin B-12 (CYANOCOBALAMIN) 1000 MCG tablet Take 1,000 mcg by mouth daily.   Yes [provider]  clomiPHENE (CLOMID) 50 MG tablet Take 25 mg by mouth daily. Pt takes 1 half tablet daily    [provider]  Ferrous Sulfate (IRON) 90 (18 Fe) MG TABS Take 2 tablets by mouth at bedtime. Patient not taking: Reported on 06/24/2020    [provider]  meloxicam (MOBIC) 15 MG tablet Take 15 mg by mouth daily. Take one tablet every other day Patient not taking: Reported on 06/24/2020    [provider]   Social History   Socioeconomic History  . Marital status: Married    Spouse name: Not on file  . Number of children: Not on file  . Years of education: Not on file  . Highest education level: Not on file  Occupational History  . Not on file  Tobacco Use  . Smoking status: Never Smoker  . Smokeless tobacco: Never Used  Vaping Use  . Vaping Use: Never used  Substance and Sexual Activity  . Alcohol use: Yes    Alcohol/week: 1.0 standard drink    Types: 1 Cans of beer per week  . Drug use: No  . Sexual activity: Not on file  Other Topics Concern  . Not on file  Social History Narrative   Right Hand   Social Determinants of Health   Financial Resource Strain: Not on file  Food Insecurity: Not on file  Transportation Needs: Not on file  Physical Activity: Not on file  Stress: Not on file  Social Connections: Not on file  Intimate Partner Violence: Not on file    Review of Systems  Constitutional: Negative for fatigue and unexpected weight change.  Eyes: Negative for visual disturbance.  Respiratory: Negative for cough, chest tightness and shortness of breath.   Cardiovascular: Negative for chest pain, palpitations and leg swelling.  Gastrointestinal: Negative for abdominal pain and blood in stool.  Neurological: Positive for tremors (same. ).  Negative for dizziness, light-headedness and headaches.     Objective:   Vitals:   06/24/20 0920  BP: (!) 144/72  Pulse: 66  Temp: 98.3 F (36.8 C)  TempSrc: Temporal  SpO2: 98%  Weight: 165 lb (74.8 kg)  Height: 5\' 10"  (1.778 m)     Physical Exam Vitals reviewed.  Constitutional:      Appearance: He is well-developed and well-nourished.  HENT:     Head: Normocephalic and atraumatic.  Eyes:     Extraocular Movements: EOM normal.     Pupils: Pupils are equal, round, and reactive to light.  Neck:     Vascular: No carotid bruit or JVD.  Cardiovascular:     Rate and Rhythm: Normal rate and regular rhythm.     Heart  sounds: Normal heart sounds. No murmur heard.   Pulmonary:     Effort: Pulmonary effort is normal.     Breath sounds: Normal breath sounds. No rales.  Musculoskeletal:        General: No edema.  Skin:    General: Skin is warm and dry.  Neurological:     Mental Status: He is alert and oriented to person, place, and time.  Psychiatric:        Mood and Affect: Mood and affect normal.        Assessment & Plan:  Stephen Conner is a 45 y.o. male . Elevated blood pressure reading  Tremor of both hands  Hand numbness  Fatigue, unspecified type  Elevated serum creatinine  Fatigue has improved.  Still monitored, having lab work performed by [facility].  Borderline blood pressure discussed, would consider low-dose of calcium channel blocker if persistent reading elevations.  Recommend recheck creatinine in the next few months, maintain hydration, avoid nephrotoxins.  Slight improvement in tremor with gabapentin, plans to discuss dose adjustments with his neurologist.  Arm symptoms have improved, continue to wrap night, gabapentin likely also helping.   No orders of the defined types were placed in this encounter.  Patient Instructions   Call Dr. Arbutus Leas about med dose changes. I am glad to hear you are feeling better.   Blood  pressure has been running a little high. I would consider a low dose of amlodipine if that remains over 130/80 - let me know about your home readings.   Have your kidney test rechecked in next few months - we can check that or can be checked at [facility].   Thanks for coming in today.   If you have lab work done today you will be contacted with your lab results within the next 2 weeks.  If you have not heard from [place] then please contact us. The fastest way to get your results is to register for My Chart.   IF you received an x-ray today, you will receive an invoice from [facility]. Please contact [facility] at [phone] with questions or concerns regarding your invoice.   IF you received labwork today, you will receive an invoice from [place]. Please contact [facility] at [phone] with questions or concerns regarding your invoice.   Our billing staff will not be able to assist you with questions regarding bills from these companies.  You will be contacted with the lab results as soon as they are available. The fastest way to get your results is to activate your My Chart account. Instructions are located on the last page of this paperwork. If you have not heard from [place] regarding the results in 2 weeks, please contact this office.         Signed, Meredith Staggers, MD [facility]

## 2020-08-16 ENCOUNTER — Emergency Department (HOSPITAL_BASED_OUTPATIENT_CLINIC_OR_DEPARTMENT_OTHER)
Admission: EM | Admit: 2020-08-16 | Discharge: 2020-08-16 | Disposition: A | Discharge status: Home / Self Care | Payer: Managed Care, Other (non HMO) | Attending: Emergency Medicine | Admitting: Emergency Medicine

## 2020-08-16 ENCOUNTER — Encounter (HOSPITAL_BASED_OUTPATIENT_CLINIC_OR_DEPARTMENT_OTHER): Payer: Self-pay

## 2020-08-16 ENCOUNTER — Other Ambulatory Visit: Payer: Self-pay

## 2020-08-16 DIAGNOSIS — S0990XA Unspecified injury of head, initial encounter: Secondary | ICD-10-CM | POA: Diagnosis present

## 2020-08-16 DIAGNOSIS — J45909 Unspecified asthma, uncomplicated: Secondary | ICD-10-CM | POA: Insufficient documentation

## 2020-08-16 DIAGNOSIS — W228XXA Striking against or struck by other objects, initial encounter: Secondary | ICD-10-CM | POA: Insufficient documentation

## 2020-08-16 DIAGNOSIS — S0101XA Laceration without foreign body of scalp, initial encounter: Secondary | ICD-10-CM | POA: Insufficient documentation

## 2020-08-16 NOTE — Discharge Instructions (Signed)
Staples are to be removed in 10 to 14 days.  He can let warm soapy water run over them.  Blot them and dry.  Call your primary care provider to set up an appointment or return to [place] or go to urgent care for removal.  Return to [place] with any concerning findings.  Tylenol and ibuprofen for headache related to head injury.  Follow-up for concussion as well.

## 2020-08-16 NOTE — ED Provider Notes (Signed)
[facility] EMERGENCY DEPARTMENT Provider Note   CSN: [id number] Arrival date & time: 08/16/20  1717     History Chief Complaint  Patient presents with  . Laceration    Scalp     Stephen Conner is a 45 y.o. male.   Laceration Location:  Head/neck Head/neck laceration location:  Scalp Length:  2 Depth:  Through dermis Quality: jagged   Bleeding: controlled   Laceration mechanism:  Blunt object Pain details:    Quality:  Aching Relieved by:  Nothing Worsened by:  Nothing Ineffective treatments:  None tried Tetanus status:  Up to date Associated symptoms: no fever, no numbness and no rash        Past Medical History:  Diagnosis Date  . Asthma    As a child  . Migraine   . Rhabdomyolysis   . Urticaria     Patient Active Problem List   Diagnosis Date Noted  . 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 Hypokalemia 11/01/2019  . Testosterone deficiency 11/01/2019  . 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 Seasonal and perennial allergic rhinitis 05/24/2016    Past Surgical History:  Procedure Laterality Date  . SHOULDER SURGERY     Right x 2        Family History  Problem Relation Age of Onset  . Fibromyalgia Mother   . Hypertension Mother   . Multiple sclerosis Brother   . Sleep apnea Father   . Other Father        shoulder surgeries  . Polycystic ovary syndrome Sister   . Other Maternal Grandmother        shingles  . Healthy Son   . Healthy Son     Social History   Tobacco Use  . Smoking status: Never Smoker  . Smokeless tobacco: Never Used  Vaping Use  . Vaping Use: Never used  Substance Use Topics  . Alcohol use: Yes    Alcohol/week: 1.0 standard drink    Types: 1 Cans of beer per week    Comment: occasional  . Drug use: No    Home Medications Prior to Admission medications   Medication Sig Start Date End Date Taking? Authorizing Provider  ascorbic acid (VITAMIN C) 500 MG tablet Take 1,000 mg by mouth 2 (two) times daily.    [provider]  Cholecalciferol (VITAMIN D3) 125 MCG (5000 UT) TABS Take 1 tablet by mouth daily.    [provider]  gabapentin (NEURONTIN) 100 MG capsule 2 q AM, 1 q hs 05/08/20   Tat, Rebecca S, DO  MAGNESIUM PO Take 1-3 tablets by mouth in the morning and at bedtime. Take 1 tablet in the morning and Take 3 tablets at bedtime    [provider]  Menaquinone-7 (VITAMIN K2 PO) Take 150 mcg by mouth daily.    [provider]  Nutritional Supplements (DHEA PO) Take 15 mg by mouth at bedtime.    [provider]  Pregnenolone Micronized (PREGNENOLONE PO) Take 150 mg by mouth daily.    [provider]  vitamin B-12 (CYANOCOBALAMIN) 1000 MCG tablet Take 1,000 mcg by mouth daily.    [provider]    Allergies    Hydrocodone, Imitrex [sumatriptan], Tramadol, Zonisamide, and Amitriptyline  Review of Systems   Review of Systems  Constitutional: Negative for chills and fever.  HENT: Negative for congestion and rhinorrhea.   Respiratory: Negative for cough and shortness of breath.   Cardiovascular: Negative for chest pain and palpitations.  Gastrointestinal: Negative for  diarrhea, nausea and vomiting.  Genitourinary: Negative for difficulty urinating and dysuria.  Musculoskeletal: Negative for arthralgias and back pain.  Skin: Positive for wound. Negative for color change and rash.  Neurological: Positive for headaches. Negative for light-headedness.    Physical Exam Updated Vital Signs BP (!) 152/87   Pulse 75   Temp 99.6 F (37.6 C) (Oral)   Resp 20   Ht 5\' 10"  (1.778 m)   Wt 72.6 kg   SpO2 100%   BMI 22.96 kg/m   Physical Exam Vitals and nursing note reviewed. Exam conducted with a chaperone present.  Constitutional:      General: He is not in acute distress.    Appearance: Normal appearance.  HENT:     Head: Normocephalic.     Comments: Small triangular-shaped laceration 1 cm at the vertex of the scalp anteriorly.   Hemostatic.  No foreign body.    Nose: No rhinorrhea.  Eyes:     General:        Right eye: No discharge.        Left eye: No discharge.     Conjunctiva/sclera: Conjunctivae normal.  Cardiovascular:     Rate and Rhythm: Normal rate and regular rhythm.  Pulmonary:     Effort: Pulmonary effort is normal.     Breath sounds: No stridor.  Abdominal:     General: Abdomen is flat. There is no distension.     Palpations: Abdomen is soft.  Musculoskeletal:        General: No deformity or signs of injury.  Skin:    General: Skin is warm and dry.  Neurological:     General: No focal deficit present.     Mental Status: He is alert. Mental status is at baseline.     Cranial Nerves: No cranial nerve deficit.     Sensory: No sensory deficit.     Motor: No weakness.     Coordination: Coordination normal.     Gait: Gait normal.  Psychiatric:        Mood and Affect: Mood normal.        Behavior: Behavior normal.        Thought Content: Thought content normal.     ED Results / Procedures / Treatments   Labs (all labs ordered are listed, but only abnormal results are displayed) Labs Reviewed - No data to display  EKG None  Radiology No results found.  Procedures . Laceration Repair  Date/Time: 08/16/2020 6:13 PM Performed by: 10/16/2020, MD Authorized by: Sabino Donovan, MD   Consent:    Consent obtained:  Verbal   Consent given by:  Patient   Risks discussed:  Infection, pain, poor cosmetic result, need for additional repair and poor wound healing   Alternatives discussed:  No treatment Exploration:    Contaminated: no   Treatment:    Area cleansed with:  Saline   Amount of cleaning:  Standard Skin repair:    Repair method:  Staples   Number of staples:  2 Approximation:    Approximation:  Close Repair type:    Repair type:  Intermediate Post-procedure details:    Dressing:  Open (no dressing)   Procedure completion:  Tolerated     Medications Ordered in  ED Medications - No data to display  ED Course  I have reviewed the triage vital signs and the nursing notes.  Pertinent labs & imaging results that were available during my care of the patient were reviewed by  me and considered in my medical decision making (see chart for details).    MDM Rules/Calculators/A&P                          Head injury, low mechanism, no need for imaging for intracranial injury.  Normal neurologic exam.  Wound cleaned and stapled as described above.  Outpatient follow-up return precautions wound care given Final Clinical Impression(s) / ED Diagnoses Final diagnoses:  Laceration of scalp, initial encounter    Rx / DC Orders ED Discharge Orders    None       Sabino Donovan, MD 08/16/20 [id number]

## 2020-08-16 NOTE — ED Triage Notes (Signed)
Went to stand up from a kneeling position and hit his head on the corner of a garage shelf.  No loss of consciousness.  NAD

## 2020-10-08 ENCOUNTER — Ambulatory Visit: Payer: Managed Care, Other (non HMO) | Admitting: Neurology

## 2020-12-28 ENCOUNTER — Other Ambulatory Visit: Payer: Self-pay

## 2020-12-28 ENCOUNTER — Ambulatory Visit (INDEPENDENT_AMBULATORY_CARE_PROVIDER_SITE_OTHER): Payer: Managed Care, Other (non HMO) | Admitting: Registered Nurse

## 2020-12-28 ENCOUNTER — Encounter: Payer: Self-pay | Admitting: Registered Nurse

## 2020-12-28 VITALS — BP 136/81 | HR 96 | Temp 98.0°F | Resp 18 | Ht 70.0 in | Wt 165.2 lb

## 2020-12-28 DIAGNOSIS — Z1329 Encounter for screening for other suspected endocrine disorder: Secondary | ICD-10-CM

## 2020-12-28 DIAGNOSIS — Z13220 Encounter for screening for lipoid disorders: Secondary | ICD-10-CM

## 2020-12-28 DIAGNOSIS — Z130 Encounter for screening for diseases of the blood and blood-forming organs and certain disorders involving the immune mechanism: Secondary | ICD-10-CM

## 2020-12-28 DIAGNOSIS — Z0000 Encounter for general adult medical examination without abnormal findings: Secondary | ICD-10-CM | POA: Diagnosis not present

## 2020-12-28 DIAGNOSIS — Z13228 Encounter for screening for other metabolic disorders: Secondary | ICD-10-CM | POA: Diagnosis not present

## 2020-12-28 NOTE — Patient Instructions (Addendum)
Mr. Stephen Conner -   From what I can tell, you continue to be healthy.  Labs will be back tomorrow. I'll call with any urgent concerns  If things stay steady, see you in a year!  Thanks,  Rich     If you have lab work done today you will be contacted with your lab results within the next 2 weeks.  If you have not heard from [place] then please contact us. The fastest way to get your results is to register for My Chart.   IF you received an x-ray today, you will receive an invoice from [facility]. Please contact [facility] at [phone] with questions or concerns regarding your invoice.   IF you received labwork today, you will receive an invoice from [place]. Please contact [facility] at [phone] with questions or concerns regarding your invoice.   Our billing staff will not be able to assist you with questions regarding bills from these companies.  You will be contacted with the lab results as soon as they are available. The fastest way to get your results is to activate your My Chart account. Instructions are located on the last page of this paperwork. If you have not heard from [place] regarding the results in 2 weeks, please contact this office.

## 2020-12-28 NOTE — Progress Notes (Signed)
Established Patient Office Visit  Subjective:  Patient ID: Stephen Conner, male    DOB: [birth year]  Age: 45 y.o. MRN: [id number]  CC:  Chief Complaint  Patient presents with   Annual Exam    Patient states he is here for is exam. Patient has no concerns.    HPI [name] presents for CPE  Histories reviewed and updated with patient.   No acute concerns.   Past Medical History:  Diagnosis Date   Asthma    As a child   Migraine    Rhabdomyolysis    Urticaria     Past Surgical History:  Procedure Laterality Date   SHOULDER SURGERY     Right x 2     Family History  Problem Relation Age of Onset   Fibromyalgia Mother    Hypertension Mother    Multiple sclerosis Brother    Sleep apnea Father    Other Father        shoulder surgeries   Polycystic ovary syndrome Sister    Other Maternal Grandmother        shingles   Healthy Son    Healthy Son     Social History   Socioeconomic History   Marital status: Married    Spouse name: Not on file   Number of children: Not on file   Years of education: Not on file   Highest education level: Not on file  Occupational History   Not on file  Tobacco Use   Smoking status: Never   Smokeless tobacco: Never  Vaping Use   Vaping Use: Never used  Substance and Sexual Activity   Alcohol use: Yes    Alcohol/week: 1.0 standard drink    Types: 1 Cans of beer per week    Comment: occasional   Drug use: No   Sexual activity: Not on file  Other Topics Concern   Not on file  Social History Narrative   Right Hand   Social Determinants of Health   Financial Resource Strain: Not on file  Food Insecurity: Not on file  Transportation Needs: Not on file  Physical Activity: Not on file  Stress: Not on file  Social Connections: Not on file  Intimate Partner Violence: Not on file    Outpatient Medications Prior to Visit  Medication Sig Dispense Refill   ascorbic acid (VITAMIN C) 500 MG tablet Take 1,000 mg by  mouth 2 (two) times daily.     Cholecalciferol (VITAMIN D3) 125 MCG (5000 UT) TABS Take 1 tablet by mouth daily.     MAGNESIUM PO Take 1-3 tablets by mouth in the morning and at bedtime. Take 1 tablet in the morning and Take 3 tablets at bedtime     Menaquinone-7 (VITAMIN K2 PO) Take 150 mcg by mouth daily.     Nutritional Supplements (DHEA PO) Take 15 mg by mouth at bedtime.     Pregnenolone Micronized (PREGNENOLONE PO) Take 150 mg by mouth daily.     vitamin B-12 (CYANOCOBALAMIN) 1000 MCG tablet Take 1,000 mcg by mouth daily.     gabapentin (NEURONTIN) 100 MG capsule 2 q AM, 1 q hs 270 capsule 1   No facility-administered medications prior to visit.    Allergies  Allergen Reactions   Hydrocodone Itching   Imitrex [Sumatriptan] Anaphylaxis   Tramadol Other (See Comments)    twitchy   Zonisamide Rash   Amitriptyline Other (See Comments)    Per pt - no mood    ROS Review  of Systems  Constitutional: Negative.   HENT: Negative.    Eyes: Negative.   Respiratory: Negative.    Cardiovascular: Negative.   Gastrointestinal: Negative.   Genitourinary: Negative.   Musculoskeletal: Negative.   Skin: Negative.   Neurological: Negative.   Psychiatric/Behavioral: Negative.    All other systems reviewed and are negative.    Objective:    Physical Exam Vitals and nursing note reviewed.  Constitutional:      General: He is not in acute distress.    Appearance: Normal appearance. He is normal weight. He is not ill-appearing, toxic-appearing or diaphoretic.  HENT:     Head: Normocephalic and atraumatic.     Right Ear: Tympanic membrane, ear canal and external ear normal. There is no impacted cerumen.     Left Ear: Tympanic membrane, ear canal and external ear normal. There is no impacted cerumen.     Nose: Nose normal. No congestion or rhinorrhea.     Mouth/Throat:     Mouth: Mucous membranes are moist.     Pharynx: Oropharynx is clear. No oropharyngeal exudate or posterior  oropharyngeal erythema.  Eyes:     General: No scleral icterus.       Right eye: No discharge.        Left eye: No discharge.     Extraocular Movements: Extraocular movements intact.     Conjunctiva/sclera: Conjunctivae normal.     Pupils: Pupils are equal, round, and reactive to light.  Neck:     Vascular: No carotid bruit.  Cardiovascular:     Rate and Rhythm: Normal rate and regular rhythm.     Pulses: Normal pulses.     Heart sounds: Normal heart sounds. No murmur heard.   No friction rub. No gallop.  Pulmonary:     Effort: Pulmonary effort is normal. No respiratory distress.     Breath sounds: Normal breath sounds. No stridor. No wheezing, rhonchi or rales.  Chest:     Chest wall: No tenderness.  Abdominal:     General: Abdomen is flat. Bowel sounds are normal. There is no distension.     Palpations: Abdomen is soft. There is no mass.     Tenderness: There is no abdominal tenderness. There is no right CVA tenderness, left CVA tenderness, guarding or rebound.     Hernia: No hernia is present.  Musculoskeletal:        General: No swelling, tenderness, deformity or signs of injury. Normal range of motion.     Cervical back: Normal range of motion and neck supple. No rigidity or tenderness.     Right lower leg: No edema.     Left lower leg: No edema.  Lymphadenopathy:     Cervical: No cervical adenopathy.  Skin:    General: Skin is warm and dry.     Capillary Refill: Capillary refill takes less than 2 seconds.     Coloration: Skin is not jaundiced or pale.     Findings: No bruising, erythema, lesion or rash.  Neurological:     General: No focal deficit present.     Mental Status: He is alert and oriented to person, place, and time. Mental status is at baseline.     Cranial Nerves: No cranial nerve deficit.     Motor: No weakness.     Gait: Gait normal.  Psychiatric:        Mood and Affect: Mood normal.        Behavior: Behavior normal.  Thought Content: Thought  content normal.        Judgment: Judgment normal.    BP 136/81   Pulse 96   Temp 98 F (36.7 C) (Temporal)   Resp 18   Ht 5\' 10"  (1.778 m)   Wt 165 lb 3.2 oz (74.9 kg)   SpO2 99%   BMI 23.70 kg/m  Wt Readings from Last 3 Encounters:  12/28/20 165 lb 3.2 oz (74.9 kg)  08/16/20 160 lb (72.6 kg)  06/24/20 165 lb (74.8 kg)     There are no preventive care reminders to display for this patient.  There are no preventive care reminders to display for this patient.  Lab Results  Component Value Date   TSH 1.170 12/19/2019   Lab Results  Component Value Date   WBC 5.0 12/19/2019   HGB 16.6 12/19/2019   HCT 49.9 12/19/2019   MCV 88 12/19/2019   PLT 270 12/19/2019   Lab Results  Component Value Date   NA 141 01/09/2020   K 4.0 01/09/2020   CO2 26 01/09/2020   GLUCOSE 87 01/09/2020   BUN 12 01/09/2020   CREATININE 1.19 01/09/2020   BILITOT 0.6 12/19/2019   ALKPHOS 67 12/19/2019   AST 20 12/19/2019   ALT 17 12/19/2019   PROT 8.1 12/19/2019   ALBUMIN 4.9 12/19/2019   CALCIUM 9.5 01/09/2020   ANIONGAP 8 11/05/2019   No results found for: CHOL No results found for: HDL No results found for: LDLCALC No results found for: TRIG No results found for: CHOLHDL No results found for: 11/07/2019    Assessment & Plan:   Problem List Items Addressed This Visit   None Visit Diagnoses     Annual physical exam    -  Primary   Screening for endocrine, metabolic and immunity disorder       Relevant Orders   CBC with Differential/Platelet   Comprehensive metabolic panel   Hemoglobin A1c   TSH   Lipid screening       Relevant Orders   Lipid panel       No orders of the defined types were placed in this encounter.   Follow-up: Return in about 1 year (around 12/28/2021) for CPE and labs.   PLAN Exam unremarkable Labs collected. Will follow up with the patient as warranted. Return annually Patient encouraged to call clinic with any questions, comments, or  concerns.  02/27/2022, NP

## 2020-12-29 LAB — CBC WITH DIFFERENTIAL/PLATELET
Basophils Absolute: 0 10*3/uL (ref 0.0–0.2)
Basos: 0 %
EOS (ABSOLUTE): 0 10*3/uL (ref 0.0–0.4)
Eos: 1 %
Hematocrit: 49.1 % (ref 37.5–51.0)
Hemoglobin: 16.9 g/dL (ref 13.0–17.7)
Immature Grans (Abs): 0 10*3/uL (ref 0.0–0.1)
Immature Granulocytes: 0 %
Lymphocytes Absolute: 2 10*3/uL (ref 0.7–3.1)
Lymphs: 32 %
MCH: 31.9 pg (ref 26.6–33.0)
MCHC: 34.4 g/dL (ref 31.5–35.7)
MCV: 93 fL (ref 79–97)
Monocytes Absolute: 0.5 10*3/uL (ref 0.1–0.9)
Monocytes: 7 %
Neutrophils Absolute: 3.8 10*3/uL (ref 1.4–7.0)
Neutrophils: 60 %
Platelets: 181 10*3/uL (ref 150–450)
RBC: 5.3 x10E6/uL (ref 4.14–5.80)
RDW: 12.8 % (ref 11.6–15.4)
WBC: 6.2 10*3/uL (ref 3.4–10.8)

## 2020-12-29 LAB — COMPREHENSIVE METABOLIC PANEL
ALT: 19 IU/L (ref 0–44)
AST: 17 IU/L (ref 0–40)
Albumin/Globulin Ratio: 1.8 (ref 1.2–2.2)
Albumin: 4.7 g/dL (ref 4.0–5.0)
Alkaline Phosphatase: 62 IU/L (ref 44–121)
BUN/Creatinine Ratio: 13 (ref 9–20)
BUN: 15 mg/dL (ref 6–24)
Bilirubin Total: 0.4 mg/dL (ref 0.0–1.2)
CO2: 22 mmol/L (ref 20–29)
Calcium: 9.9 mg/dL (ref 8.7–10.2)
Chloride: 104 mmol/L (ref 96–106)
Creatinine, Ser: 1.18 mg/dL (ref 0.76–1.27)
Globulin, Total: 2.6 g/dL (ref 1.5–4.5)
Glucose: 104 mg/dL — ABNORMAL HIGH (ref 65–99)
Potassium: 3.7 mmol/L (ref 3.5–5.2)
Sodium: 144 mmol/L (ref 134–144)
Total Protein: 7.3 g/dL (ref 6.0–8.5)
eGFR: 79 mL/min/{1.73_m2} (ref >59)

## 2020-12-29 LAB — LIPID PANEL
Chol/HDL Ratio: 3.3 ratio (ref 0.0–5.0)
Cholesterol, Total: 207 mg/dL — ABNORMAL HIGH (ref 100–199)
HDL: 63 mg/dL (ref >39)
LDL Chol Calc (NIH): 125 mg/dL — ABNORMAL HIGH (ref 0–99)
Triglycerides: 107 mg/dL (ref 0–149)
VLDL Cholesterol Cal: 19 mg/dL (ref 5–40)

## 2020-12-29 LAB — TSH: TSH: 0.627 u[IU]/mL (ref 0.450–4.500)

## 2020-12-29 LAB — HEMOGLOBIN A1C
Est. average glucose Bld gHb Est-mCnc: 94 mg/dL
Hgb A1c MFr Bld: 4.9 % (ref 4.8–5.6)

## 2020-12-31 ENCOUNTER — Encounter: Payer: Self-pay | Admitting: Family Medicine

## 2020-12-31 DIAGNOSIS — H9313 Tinnitus, bilateral: Secondary | ICD-10-CM

## 2021-01-01 NOTE — Telephone Encounter (Signed)
Tinnitus since January, noticed after taking gabapentin. Worse with higher dose. Still there after using gabapentin. Last week or so louder. No loss of hearing. No pain in ears, no drainage/bleeding. High pitch, continuous. Worse on left. No associated HA or vertigo.  Will refer to ENT.

## 2021-03-22 DIAGNOSIS — H9313 Tinnitus, bilateral: Secondary | ICD-10-CM | POA: Insufficient documentation

## 2021-03-22 DIAGNOSIS — H903 Sensorineural hearing loss, bilateral: Secondary | ICD-10-CM | POA: Insufficient documentation

## 2021-08-19 ENCOUNTER — Ambulatory Visit
Admission: EM | Admit: 2021-08-19 | Discharge: 2021-08-19 | Disposition: A | Discharge status: Home / Self Care | Payer: 59 | Attending: Emergency Medicine | Admitting: Emergency Medicine

## 2021-08-19 DIAGNOSIS — M545 Low back pain, unspecified: Secondary | ICD-10-CM

## 2021-08-19 MED ORDER — METHYLPREDNISOLONE 4 MG PO TBPK: ORAL_TABLET | ORAL | 0 refills | Status: DC

## 2021-08-19 MED ORDER — TRIAMCINOLONE ACETONIDE 40 MG/ML IJ SUSP
40.0000 mg | Freq: Once | INTRAMUSCULAR | Status: AC
  Administered 2021-08-19: 40 mg via INTRAMUSCULAR

## 2021-08-19 MED ORDER — BACLOFEN 10 MG PO TABS: 10.0000 mg | ORAL_TABLET | Freq: Every day | ORAL | 0 refills | Status: AC

## 2021-08-19 NOTE — ED Triage Notes (Signed)
Pt  c/o lower back pain since Saturday.  ?

## 2021-08-19 NOTE — Discharge Instructions (Signed)
You received an injection of triamcinolone during your visit today to help reduce inflammation in your lower back that is causing spasm and pain.  I have also provided you with a prescription for baclofen which is an antispasmodic muscle relaxer that you can take at bedtime nightly. ? ?I also provided you with a prescription for a tapering dose of Medrol that you are welcome to take if you feel that the injection of triamcinolone did not entirely relieve your pain. ? ?If you are not feeling better in the next 5 to 7 days, I encourage you to consider returning to the clinic for an x-ray of your lumbar spine to rule out any signs of degenerative disease. ? ?Thank you for visiting urgent care today. ?

## 2021-08-19 NOTE — ED Provider Notes (Signed)
?[facility] ? ? ? ?CSN: [id number] ?Arrival date & time: 08/19/21  1158 ?  ? ?HISTORY  ? ?Chief Complaint  ?Patient presents with  ? Back Pain  ? ?HPI ?Stephen Conner is a 46 y.o. male. Patient presents urgent care complaining of lower back pain for the past 6 days.  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  Patient states his station was not set up ergonomically and had to do a lot of forward bending, states he was at work for 9 hours without sitting down.  Patient states that his back was a little sore after the festival ended but believes that the 3-hour ride back home in his car is what really caused the discomfort in his back at this time.  Patient states he has tried using a TENS unit, has tried stretching, has tried taking meloxicam, ibuprofen, taking magnesium sulfate baths with no relief of his symptoms.  Patient denies loss of range of motion, pain radiating down to either leg, loss of bowel or bladder function, recent fall. ? ?The history is provided by the patient.  ?Past Medical History:  ?Diagnosis Date  ? Asthma   ? As a child  ? Migraine   ? Rhabdomyolysis   ? Urticaria   ? ?Patient Active Problem List  ? Diagnosis Date Noted  ? Rhabdomyolysis 11/01/2019  ? Hypokalemia 11/01/2019  ? Testosterone deficiency 11/01/2019  ? 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 Seasonal and perennial allergic rhinitis 05/24/2016  ? ?Past Surgical History:  ?Procedure Laterality Date  ? SHOULDER SURGERY    ? Right x 2   ? ? ?Home Medications   ? ?Prior to Admission medications   ?Medication Sig Start Date End Date Taking? Authorizing Provider  ?ascorbic acid (VITAMIN C) 500 MG tablet Take 1,000 mg by mouth 2 (two) times daily.    [provider]  ?Cholecalciferol (VITAMIN D3) 125 MCG (5000 UT) TABS Take 1 tablet by mouth daily.    [provider]  ?MAGNESIUM PO Take 1-3 tablets by mouth in the morning and at  bedtime. Take 1 tablet in the morning and Take 3 tablets at bedtime    [provider]  ?Menaquinone-7 (VITAMIN K2 PO) Take 150 mcg by mouth daily.    [provider]  ?Nutritional Supplements (DHEA PO) Take 15 mg by mouth at bedtime.    [provider]  ?Pregnenolone Micronized (PREGNENOLONE PO) Take 150 mg by mouth daily.    [provider]  ?vitamin B-12 (CYANOCOBALAMIN) 1000 MCG tablet Take 1,000 mcg by mouth daily.    [provider]  ? ? ?Family History ?Family History  ?Problem Relation Age of Onset  ? Fibromyalgia Mother   ? Hypertension Mother   ? Multiple sclerosis Brother   ? Sleep apnea Father   ? Other Father   ?     shoulder surgeries  ? Polycystic ovary syndrome Sister   ? Other Maternal Grandmother   ?     shingles  ? Healthy Son   ? Healthy Son   ? ?Social History ?Social History  ? ?Tobacco Use  ? Smoking status: Never  ? Smokeless tobacco: Never  ?Vaping Use  ? Vaping Use: Never used  ?Substance Use Topics  ? Alcohol use: Yes  ?  Alcohol/week: 1.0 standard drink  ?  Types: 1 Cans of beer per week  ?  Comment: occasional  ?  Drug use: No  ? ?Allergies   ?Hydrocodone, Imitrex [sumatriptan], Tramadol, Zonisamide, and Amitriptyline ? ?Review of Systems ?Review of Systems ?Pertinent findings noted in history of present illness.  ? ?Physical Exam ?Triage Vital Signs ?ED Triage Vitals  ?Enc Vitals Group  ?   BP 02/12/21 0827 (!) 147/82  ?   Pulse Rate 02/12/21 0827 72  ?   Resp 02/12/21 0827 18  ?   Temp 02/12/21 0827 98.3 ?F (36.8 ?C)  ?   Temp Source 02/12/21 0827 Oral  ?   SpO2 02/12/21 0827 98 %  ?   Weight --   ?   Height --   ?   Head Circumference --   ?   Peak Flow --   ?   Pain Score 02/12/21 0826 5  ?   Pain Loc --   ?   Pain Edu? --   ?   Excl. in GC? --   ?No data found. ? ?Updated Vital Signs ?BP (!) 137/97 (BP Location: Right Arm)   Pulse 62   Temp 99.1 ?F (37.3 ?C) (Oral)   Resp 18   SpO2 97%  ? ?Physical Exam ?Vitals and nursing note  reviewed.  ?Constitutional:   ?   General: He is not in acute distress. ?   Appearance: Normal appearance. He is not ill-appearing.  ?HENT:  ?   Head: Normocephalic and atraumatic.  ?Eyes:  ?   General: Lids are normal.     ?   Right eye: No discharge.     ?   Left eye: No discharge.  ?   Extraocular Movements: Extraocular movements intact.  ?   Conjunctiva/sclera: Conjunctivae normal.  ?   Right eye: Right conjunctiva is not injected.  ?   Left eye: Left conjunctiva is not injected.  ?Neck:  ?   Trachea: Trachea and phonation normal.  ?Cardiovascular:  ?   Rate and Rhythm: Normal rate and regular rhythm.  ?   Pulses: Normal pulses.  ?   Heart sounds: Normal heart sounds. No murmur heard. ?  No friction rub. No gallop.  ?Pulmonary:  ?   Effort: Pulmonary effort is normal. No accessory muscle usage, prolonged expiration or respiratory distress.  ?   Breath sounds: Normal breath sounds. No stridor, decreased air movement or transmitted upper airway sounds. No decreased breath sounds, wheezing, rhonchi or rales.  ?Chest:  ?   Chest wall: No tenderness.  ?Musculoskeletal:     ?   General: Tenderness (Lumbar paraspinous muscles) present. Normal range of motion.  ?   Cervical back: Normal range of motion and neck supple. Normal range of motion.  ?Lymphadenopathy:  ?   Cervical: No cervical adenopathy.  ?Skin: ?   General: Skin is warm and dry.  ?   Findings: No erythema or rash.  ?Neurological:  ?   General: No focal deficit present.  ?   Mental Status: He is alert and oriented to person, place, and time.  ?Psychiatric:     ?   Mood and Affect: Mood normal.     ?   Behavior: Behavior normal.  ? ? ?Visual Acuity ?Right Eye Distance:   ?Left Eye Distance:   ?Bilateral Distance:   ? ?Right Eye Near:   ?Left Eye Near:    ?Bilateral Near:    ? ?UC Couse / Diagnostics / Procedures:  ?  ?EKG ? ?Radiology ?No results found. ? ?Procedures ?Procedures (including critical care time) ? ?UC Diagnoses /  Final Clinical Impressions(s)    ?I have reviewed the triage vital signs and the nursing notes. ? ?Pertinent labs & imaging results that were available during my care of the patient were reviewed by me and considered in my medical decision making (see chart for details).   ? ?Final diagnoses:  ?Acute bilateral low back pain without sciatica  ? ?begin Medrol dose pack.,  , Take baclofen once daily 1 hour prior to bedtime.,  , Apply ice pack to affected area 4 times daily for 20 minutes each time,  , Instructions to alternate ice and heat to affected areas 20 minutes each,  , Apply topical anti-inflammatory creams such as Voltaren gel, Capsaicin and Aspercreme,  , Avoid stretching or strengthening exercises until pain is completely resolved,  , and Return to urgent care in the next 5 to 7 days if not improved for imaging of his lumbar spine. ? ? ?ED Prescriptions   ? ? Medication Sig Dispense Auth. Provider  ? methylPREDNISolone (MEDROL DOSEPAK) 4 MG TBPK tablet Take 24 mg on day 1, 20 mg on day 2, 16 mg on day 3, 12 mg on day 4, 8 mg on day 5, 4 mg on day 6. 21 tablet Theadora Rama Scales, PA-C  ? baclofen (LIORESAL) 10 MG tablet Take 1 tablet (10 mg total) by mouth at bedtime for 7 days. 7 tablet Theadora Rama Scales, PA-C  ? ?  ? ?PDMP not reviewed this encounter. ? ?Pending results:  ?Labs Reviewed - No data to display ? ?Medications Ordered in UC: ?Medications  ?triamcinolone acetonide (KENALOG-40) injection 40 mg (40 mg Intramuscular Given 08/19/21 1344)  ? ? ?Disposition Upon Discharge:  ?Condition: stable for discharge home ?Home: take medications as prescribed; routine discharge instructions as discussed; follow up as advised. ? ?Patient presented with an acute illness with associated systemic symptoms and significant discomfort requiring urgent management. In my opinion, this is a condition that a prudent lay person (someone who possesses an average knowledge of health and medicine) may potentially expect to result in complications if  not addressed urgently such as respiratory distress, impairment of bodily function or dysfunction of bodily organs.  ? ?Routine symptom specific, illness specific and/or disease specific instructions were discussed wit

## 2022-06-24 ENCOUNTER — Encounter: Payer: Self-pay | Admitting: Family Medicine

## 2022-06-24 ENCOUNTER — Ambulatory Visit (HOSPITAL_BASED_OUTPATIENT_CLINIC_OR_DEPARTMENT_OTHER)
Admission: RE | Admit: 2022-06-24 | Discharge: 2022-06-24 | Disposition: A | Discharge status: Home / Self Care | Payer: 59 | Source: Ambulatory Visit | Attending: Family Medicine | Admitting: Family Medicine

## 2022-06-24 ENCOUNTER — Ambulatory Visit (INDEPENDENT_AMBULATORY_CARE_PROVIDER_SITE_OTHER): Payer: 59 | Admitting: Family Medicine

## 2022-06-24 VITALS — BP 132/60 | HR 87 | Temp 98.8°F | Ht 70.0 in | Wt 178.0 lb

## 2022-06-24 DIAGNOSIS — R350 Frequency of micturition: Secondary | ICD-10-CM | POA: Diagnosis present

## 2022-06-24 DIAGNOSIS — N50811 Right testicular pain: Secondary | ICD-10-CM | POA: Diagnosis present

## 2022-06-24 DIAGNOSIS — N453 Epididymo-orchitis: Secondary | ICD-10-CM | POA: Diagnosis not present

## 2022-06-24 DIAGNOSIS — N5089 Other specified disorders of the male genital organs: Secondary | ICD-10-CM | POA: Insufficient documentation

## 2022-06-24 LAB — POCT URINALYSIS DIP (MANUAL ENTRY)
Bilirubin, UA: NEGATIVE
Blood, UA: NEGATIVE
Glucose, UA: NEGATIVE mg/dL
Ketones, POC UA: NEGATIVE mg/dL
Leukocytes, UA: NEGATIVE
Nitrite, UA: NEGATIVE
Protein Ur, POC: NEGATIVE mg/dL
Spec Grav, UA: 1.01 (ref 1.010–1.025)
Urobilinogen, UA: 0.2 E.U./dL
pH, UA: 6 (ref 5.0–8.0)

## 2022-06-24 MED ORDER — LEVOFLOXACIN 500 MG PO TABS: 500.0000 mg | ORAL_TABLET | Freq: Every day | ORAL | 0 refills | Status: DC

## 2022-06-24 NOTE — Patient Instructions (Addendum)
I will check the urine test, but ultrasound also ordered.  Will have that ordered today, our staff should be calling you shortly about the timing and location.  As we discussed I am suspicious for a possible infection of the testicle or the gland behind the testicle.  Start antibiotic for now, and recheck in the next 1 week.  Return to the clinic or go to the nearest emergency room if any of your symptoms worsen or new symptoms occur.  Orchitis  Orchitis is inflammation of a testicle. Testicles are the male organs that produce sperm. The testicles are held in a fleshy sac (scrotum) located behind the penis. Orchitis usually affects only one testicle, but it can affect both. Orchitis is caused by infection. Many kinds of bacteria and viruses can cause this infection. The condition can develop suddenly. What are the causes? This condition may be caused by: Infection from viruses or bacteria. Other organisms, such as fungi or parasites. This is rare but can happen in men who have a weak body defense system (immune system), such as men who have HIV. Bacteria  Bacterial orchitis often occurs along with an infection of the tube that collects and stores sperm (epididymis). In men who are not sexually active, this infection usually starts as a urinary tract infection and spreads to the testicle. In sexually active men, sexually transmitted infections (STIs) are the most common cause of bacterial orchitis. These can include: Gonorrhea. Chlamydia. Viruses Mumps is the most common cause of viral orchitis, though mumps is now rare in many areas because of vaccination. Other viruses that can cause orchitis include: The chickenpox virus (varicella-zoster virus). The virus that causes mononucleosis (Epstein-Barr virus). What increases the risk? The following factors may make you more likely to develop this condition: For viral orchitis: Not having been vaccinated against mumps. For bacterial  orchitis: Having had frequent urinary tract infections. Engaging in high-risk sexual behaviors, such as having multiple sexual partners or having sex without using a condom. Having a sexual partner with an STI. Having had urinary tract surgery. Using a tube that is passed through the penis to drain urine (Foley catheter). Having an enlarged prostate gland. What are the signs or symptoms? The most common symptoms of orchitis are swelling and pain in the scrotum. Other signs and symptoms may include: Feeling generally sick (malaise). Fever and chills. Painful urination. Painful ejaculation. Headache. Fatigue. Nausea. Blood or discharge from the penis. Swollen lymph nodes in the groin area (inguinal nodes). How is this diagnosed? This condition may be diagnosed based on: Your symptoms. Your health care provider may suspect orchitis if you have a painful, swollen testicle along with other signs and symptoms of the condition. A physical exam. You may also have other tests, including: A blood test to check for signs of infection. A urine test to check for a urinary tract infection or STI. Using a swab to collect a fluid sample from the tip of the penis to test for STIs. Taking an image of the testicle using sound waves and a computer (testicular ultrasound). How is this treated? Treatment for this condition depends on the cause.  For bacterial orchitis, your health care provider may prescribe antibiotic medicines. Bacterial infections usually clear up within a few days. For both viral infections and bacterial infections, treatment may include: Rest. Anti-inflammatory medicines. Pain medicines. Raising (elevating) the scrotum with a towel or pillow and applying ice. Follow these instructions at home: Managing pain and swelling Elevate your scrotum and apply  ice as directed. To do this: Put ice in a plastic bag. Place a small towel or pillow between your legs. Rest your scrotum on the  pillow or towel. Place another towel between your skin and the plastic bag. Leave the ice on for 20 minutes, 2-3 times a day. Remove the ice if your skin turns bright red. This is very important. If you cannot feel pain, heat, or cold, you have a greater risk of damage to the area. General instructions Rest as told by your health care provider. Take over-the-counter and prescription medicines only as told by your health care provider. If you were prescribed an antibiotic medicine, take it as told by your health care provider. Do not stop taking the antibiotic even if you start to feel better. Do not have sex until your health care provider says it is okay to do so. Keep all follow-up visits. This is important. Contact a health care provider if: You have a fever. Pain and swelling have not gotten better after 3 days. Get help right away if: Your pain is getting worse. The swelling in your testicle gets worse. Summary Orchitis is inflammation of a testicle. It is caused by an infection from bacteria or a virus. The most common symptoms of orchitis are swelling and pain in the scrotum. Treatment for this condition depends on the cause. It may include medicines to fight the infection, reduce inflammation, and relieve the pain. Follow your health care provider's instructions about resting, icing, not having sex, and taking medicines. This information is not intended to replace advice given to you by your health care provider. Make sure you discuss any questions you have with your health care provider. Document Revised: 10/13/2020 Document Reviewed: 10/13/2020 Elsevier Patient Education  [place].

## 2022-06-24 NOTE — Progress Notes (Signed)
Subjective:  Patient ID: Stephen Conner, male    DOB: 02-10-1976  Age: 47 y.o. MRN: [id number]  CC:  Chief Complaint  Patient presents with   Testicle Pain    Pt notes history of kideny stones and had this similar pain pain wanted to be checked. Started consistant about 1 week ago No other sxs     HPI Stephen Conner presents for   Testicular pain: R sided testicular pain - noted earlier this week - 3-4 days ago. Some swelling of that testicle past month possibly, but only sore past few days.  No known injury. Soreness at rest last night.  Frequent urination past few days. No dysuria, no hematuria. No abd/back pain. No n/v. No penile discharge. Monogamous, no new sexual contacts.  Intercourse last night, not painful, no hematospermia.   Tx: drinking more fluids, ibuprofen this am.     History of nephrolithiasis. Similar sx's 6 years ago with kidney stone.       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 Authorizing Provider  Cholecalciferol (VITAMIN D3) 125 MCG (5000 UT) TABS Take 1 tablet by mouth daily.   Yes [provider]  MAGNESIUM PO Take 1-3 tablets by mouth in the morning and at bedtime. Take 1 tablet in the morning and Take 3 tablets at bedtime   Yes [provider]   Menaquinone-7 (VITAMIN K2 PO) Take 150 mcg by mouth daily.   Yes [provider]  Nutritional Supplements (DHEA PO) Take 15 mg by mouth at bedtime.   Yes [provider]  Omega-3 Fatty Acids (FISH OIL) 300 MG CAPS Take by mouth.   Yes [provider]  Pregnenolone Micronized (PREGNENOLONE PO) Take 150 mg by mouth daily.   Yes [provider]  vitamin B-12 (CYANOCOBALAMIN) 1000 MCG tablet Take 1,000 mcg by mouth daily.   Yes [provider]  ascorbic acid (VITAMIN C) 500 MG tablet Take 1,000 mg by mouth 2 (two) times daily. Patient not taking: Reported on 06/24/2022    [provider]  methylPREDNISolone (MEDROL DOSEPAK) 4 MG TBPK tablet Take 24 mg on day 1, 20 mg on day 2, 16 mg on day 3, 12 mg on day 4, 8 mg on day 5, 4 mg on day 6. Patient not taking: Reported on 06/24/2022 08/19/21   Lynden Oxford Scales, PA-C   Social History   Socioeconomic History   Marital status: Married    Spouse name: Not on file   Number of children: Not on file   Years of education: Not on file   Highest education level: Not on file  Occupational History   Not on file  Tobacco Use   Smoking status:  Never   Smokeless tobacco: Never  Vaping Use   Vaping Use: Never used  Substance and Sexual Activity   Alcohol use: Yes    Alcohol/week: 1.0 standard drink of alcohol    Types: 1 Cans of beer per week    Comment: occasional   Drug use: No   Sexual activity: Not on file  Other Topics Concern   Not on file  Social History Narrative   Right Hand   Social Determinants of Health   Financial Resource Strain: Not on file  Food Insecurity: Not on file  Transportation Needs: Not on file  Physical Activity: Not on file  Stress: Not on file  Social Connections: Not on file  Intimate Partner Violence: Not on file    Review of Systems   Objective:   Vitals:   06/24/22 0906  BP: 132/60  Pulse: 87  Temp: 98.8 F (37.1 C)  TempSrc: Temporal  SpO2:  97%  Weight: 178 lb (80.7 kg)  Height: '5\' 10"'$  (1.778 m)     Physical Exam Vitals reviewed.  Constitutional:      Appearance: He is well-developed.  HENT:     Head: Normocephalic and atraumatic.  Neck:     Vascular: No carotid bruit or JVD.  Cardiovascular:     Rate and Rhythm: Normal rate and regular rhythm.     Heart sounds: Normal heart sounds. No murmur heard. Pulmonary:     Effort: Pulmonary effort is normal.     Breath sounds: Normal breath sounds. No rales.  Abdominal:     General: Abdomen is flat. There is no distension.     Tenderness: There is no abdominal tenderness. There is no right CVA tenderness, left CVA tenderness or guarding.     Hernia: No hernia is present.  Genitourinary:    Comments: R testicle enlarged, ttp over epididymiis and inferiorly. No penile rash or d/c. No LAD appreciated or hernia.  Musculoskeletal:     Right lower leg: No edema.     Left lower leg: No edema.  Skin:    General: Skin is warm and dry.  Neurological:     Mental Status: He is alert and oriented to person, place, and time.  Psychiatric:        Mood and Affect: Mood normal.      Results for orders placed or performed in visit on 06/24/22  POCT urinalysis dipstick  Result Value Ref Range   Color, UA yellow yellow   Clarity, UA clear clear   Glucose, UA negative negative mg/dL   Bilirubin, UA negative negative   Ketones, POC UA negative negative mg/dL   Spec Grav, UA 1.010 1.010 - 1.025   Blood, UA negative negative   pH, UA 6.0 5.0 - 8.0   Protein Ur, POC negative negative mg/dL   Urobilinogen, UA 0.2 0.2 or 1.0 E.U./dL   Nitrite, UA Negative Negative   Leukocytes, UA Negative Negative     Assessment & Plan:  Stephen Conner is a 47 y.o. male . Urinary frequency - Plan: POCT urinalysis dipstick, US SCROTUM W/DOPPLER, Urine Culture  Right testicular pain - Plan: POCT urinalysis dipstick, US SCROTUM W/DOPPLER, levofloxacin (LEVAQUIN) 500 MG tablet  Swelling of  right testicle - Plan: POCT urinalysis dipstick, US SCROTUM W/DOPPLER, levofloxacin (LEVAQUIN) 500 MG tablet  Orchitis and epididymitis - Plan: levofloxacin (LEVAQUIN) 500 MG tablet  Right testicular pain, initially swelling past month with recent pain.  Area  urinary frequency without any hematuria.  Urinalysis reassuring.  Will check culture.  Monogamous, no new sexual contacts, hold on STI testing.  Unlikely nephrolithiasis.  Suspected epididymitis or epididymoorchitis.  Less likely torsion, less likely mass.  Check ultrasound today, with Doppler.  Start Levaquin for possible orchitis/epididymitis with potential side effects and risks discussed including but not limited to C. difficile risk, neuropathy, neuropathy risk.  Understanding expressed.  Recheck 1 week, ER/RTC precautions.  Meds ordered this encounter  Medications   levofloxacin (LEVAQUIN) 500 MG tablet    Sig: Take 1 tablet (500 mg total) by mouth daily for 10 days.    Dispense:  10 tablet    Refill:  0   Patient Instructions  I will check the urine test, but ultrasound also ordered.  Will have that ordered today, our staff should be calling you shortly about the timing and location.  As we discussed I am suspicious for a possible infection of the testicle or the gland behind the testicle.  Start antibiotic for now, and recheck in the next 1 week.  Return to the clinic or go to the nearest emergency room if any of your symptoms worsen or new symptoms occur.  Orchitis  Orchitis is inflammation of a testicle. Testicles are the male organs that produce sperm. The testicles are held in a fleshy sac (scrotum) located behind the penis. Orchitis usually affects only one testicle, but it can affect both. Orchitis is caused by infection. Many kinds of bacteria and viruses can cause this infection. The condition can develop suddenly. What are the causes? This condition may be caused by: Infection from viruses or bacteria. Other organisms,  such as fungi or parasites. This is rare but can happen in men who have a weak body defense system (immune system), such as men who have HIV. Bacteria  Bacterial orchitis often occurs along with an infection of the tube that collects and stores sperm (epididymis). In men who are not sexually active, this infection usually starts as a urinary tract infection and spreads to the testicle. In sexually active men, sexually transmitted infections (STIs) are the most common cause of bacterial orchitis. These can include: Gonorrhea. Chlamydia. Viruses Mumps is the most common cause of viral orchitis, though mumps is now rare in many areas because of vaccination. Other viruses that can cause orchitis include: The chickenpox virus (varicella-zoster virus). The virus that causes mononucleosis (Epstein-Barr virus). What increases the risk? The following factors may make you more likely to develop this condition: For viral orchitis: Not having been vaccinated against mumps. For bacterial orchitis: Having had frequent urinary tract infections. Engaging in high-risk sexual behaviors, such as having multiple sexual partners or having sex without using a condom. Having a sexual partner with an STI. Having had urinary tract surgery. Using a tube that is passed through the penis to drain urine (Foley catheter). Having an enlarged prostate gland. What are the signs or symptoms? The most common symptoms of orchitis are swelling and pain in the scrotum. Other signs and symptoms may include: Feeling generally sick (malaise). Fever and chills. Painful urination. Painful ejaculation. Headache. Fatigue. Nausea. Blood or discharge from the penis. Swollen lymph nodes in the groin area (inguinal nodes). How is this diagnosed? This condition may be diagnosed based on: Your symptoms. Your health care provider may suspect orchitis if you have a painful, swollen testicle along with other signs and symptoms of the  condition. A physical exam. You may also have other tests, including: A blood test to check for signs of infection. A urine  test to check for a urinary tract infection or STI. Using a swab to collect a fluid sample from the tip of the penis to test for STIs. Taking an image of the testicle using sound waves and a computer (testicular ultrasound). How is this treated? Treatment for this condition depends on the cause.  For bacterial orchitis, your health care provider may prescribe antibiotic medicines. Bacterial infections usually clear up within a few days. For both viral infections and bacterial infections, treatment may include: Rest. Anti-inflammatory medicines. Pain medicines. Raising (elevating) the scrotum with a towel or pillow and applying ice. Follow these instructions at home: Managing pain and swelling Elevate your scrotum and apply ice as directed. To do this: Put ice in a plastic bag. Place a small towel or pillow between your legs. Rest your scrotum on the pillow or towel. Place another towel between your skin and the plastic bag. Leave the ice on for 20 minutes, 2-3 times a day. Remove the ice if your skin turns bright red. This is very important. If you cannot feel pain, heat, or cold, you have a greater risk of damage to the area. General instructions Rest as told by your health care provider. Take over-the-counter and prescription medicines only as told by your health care provider. If you were prescribed an antibiotic medicine, take it as told by your health care provider. Do not stop taking the antibiotic even if you start to feel better. Do not have sex until your health care provider says it is okay to do so. Keep all follow-up visits. This is important. Contact a health care provider if: You have a fever. Pain and swelling have not gotten better after 3 days. Get help right away if: Your pain is getting worse. The swelling in your testicle gets  worse. Summary Orchitis is inflammation of a testicle. It is caused by an infection from bacteria or a virus. The most common symptoms of orchitis are swelling and pain in the scrotum. Treatment for this condition depends on the cause. It may include medicines to fight the infection, reduce inflammation, and relieve the pain. Follow your health care provider's instructions about resting, icing, not having sex, and taking medicines. This information is not intended to replace advice given to you by your health care provider. Make sure you discuss any questions you have with your health care provider. Document Revised: 10/13/2020 Document Reviewed: 10/13/2020 Elsevier Patient Education  [place],   Merri Ray, MD [place] 06/24/22 9:57 AM

## 2022-06-25 ENCOUNTER — Encounter: Payer: Self-pay | Admitting: Family Medicine

## 2022-06-25 DIAGNOSIS — N453 Epididymo-orchitis: Secondary | ICD-10-CM

## 2022-06-25 LAB — URINE CULTURE
MICRO NUMBER:: 14668576
SPECIMEN QUALITY:: ADEQUATE

## 2022-06-27 MED ORDER — DOXYCYCLINE HYCLATE 100 MG PO TABS: 100.0000 mg | ORAL_TABLET | Freq: Two times a day (BID) | ORAL | 0 refills | Status: DC

## 2022-06-27 NOTE — Telephone Encounter (Signed)
Pt stopped levofloxacin due to knee pains, asking for next steps

## 2022-06-29 ENCOUNTER — Ambulatory Visit: Payer: 59 | Admitting: Family Medicine

## 2022-07-01 ENCOUNTER — Ambulatory Visit (INDEPENDENT_AMBULATORY_CARE_PROVIDER_SITE_OTHER): Payer: 59 | Admitting: Family Medicine

## 2022-07-01 ENCOUNTER — Encounter: Payer: Self-pay | Admitting: Family Medicine

## 2022-07-01 VITALS — BP 116/60 | HR 63 | Temp 98.1°F | Ht 70.0 in | Wt 177.0 lb

## 2022-07-01 DIAGNOSIS — N50811 Right testicular pain: Secondary | ICD-10-CM

## 2022-07-01 DIAGNOSIS — N453 Epididymo-orchitis: Secondary | ICD-10-CM | POA: Diagnosis not present

## 2022-07-01 NOTE — Patient Instructions (Signed)
Glad you are improving.  Continue antibiotics for the full course, follow-up if symptoms are not continuing to improve or worsen off antibiotics.  Good luck with the new bike saddle.  Take care!

## 2022-07-01 NOTE — Progress Notes (Signed)
Subjective:  Patient ID: Stephen Conner, male    DOB: Jul 06, 1975  Age: 47 y.o. MRN: [id number]  CC:  Chief Complaint  Patient presents with   Groin Pain    Pt notes mostly resolved has had some episodes but more moderate     HPI [name] presents for   Chronic pain, urinary frequency, suspected orchitis, epididymitis of right testicle at his March visit.  He was started on Levaquin, urinalysis, urine culture, scrotal ultrasound were overall reassuring.  Mixed genital flora on urine culture.  Some possible tendinopathy with anterior knee pain noted on March 9. Dysesthesias with Levaquin, changed to doxycycline on March 11. S/p 5 days doxycycline. No side effects.  Pain in R has improved. Usually not noticing. Some exercises and biking usually, only on mobility stretching and some biking. Sore yesterday into groin/testicle.  No fever, dysuria. Min urinary frequency.  Decreased swelling. No rash, no penile d/c.    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 Authorizing Provider  Cholecalciferol (VITAMIN D3) 125 MCG (5000 UT) TABS Take 1 tablet by mouth daily.   Yes [provider]  doxycycline (VIBRA-TABS) 100 MG tablet Take 1 tablet (100 mg total) by mouth 2 (two)  times daily. 06/27/22  Yes Wendie Agreste, MD  MAGNESIUM PO Take 1-3 tablets by mouth in the morning and at bedtime. Take 1 tablet in the morning and Take 3 tablets at bedtime   Yes [provider]  Menaquinone-7 (VITAMIN K2 PO) Take 150 mcg by mouth daily.   Yes [provider]  Nutritional Supplements (DHEA PO) Take 15 mg by mouth at bedtime.   Yes [provider]  Omega-3 Fatty Acids (FISH OIL) 300 MG CAPS Take by mouth.   Yes [provider]  Pregnenolone Micronized (PREGNENOLONE PO) Take 150 mg by mouth daily.   Yes [provider]  vitamin B-12 (CYANOCOBALAMIN) 1000 MCG tablet Take 1,000 mcg by mouth daily.   Yes [provider]  ascorbic acid (VITAMIN C) 500 MG tablet Take 1,000 mg by mouth 2 (two) times daily. Patient not taking: Reported on 06/24/2022    [provider]  methylPREDNISolone (MEDROL DOSEPAK) 4 MG TBPK tablet Take 24 mg on day 1, 20 mg on day 2, 16 mg on day 3, 12 mg on day 4, 8 mg on day 5, 4 mg on day 6. Patient not taking: Reported on 06/24/2022 08/19/21   Lynden Oxford Scales, PA-C   Social History   Socioeconomic History   Marital status: Married    Spouse name: Not on file   Number of children: Not on file   Years of education: Not on file  Highest education level: Not on file  Occupational History   Not on file  Tobacco Use   Smoking status: Never   Smokeless tobacco: Never  Vaping Use   Vaping Use: Never used  Substance and Sexual Activity   Alcohol use: Yes    Alcohol/week: 1.0 standard drink of alcohol    Types: 1 Cans of beer per week    Comment: occasional   Drug use: No   Sexual activity: Not on file  Other Topics Concern   Not on file  Social History Narrative   Right Hand   Social Determinants of Health   Financial Resource Strain: Not on file  Food Insecurity: Not on file  Transportation Needs: Not on file  Physical Activity: Not on file  Stress: Not on file  Social  Connections: Not on file  Intimate Partner Violence: Not on file    Review of Systems   Objective:   Vitals:   07/01/22 1027  BP: 116/60  Pulse: 63  Temp: 98.1 F (36.7 C)  TempSrc: Temporal  SpO2: 99%  Weight: 177 lb (80.3 kg)  Height: 5\' 10"  (1.778 m)     Physical Exam Constitutional:      General: He is not in acute distress.    Appearance: Normal appearance. He is well-developed.  HENT:     Head: Normocephalic and atraumatic.  Cardiovascular:     Rate and Rhythm: Normal rate.  Pulmonary:     Effort: Pulmonary effort is normal.  Abdominal:     General: Abdomen is flat.     Tenderness: There is no abdominal tenderness. There is no right CVA tenderness or left CVA tenderness.  Neurological:     Mental Status: He is alert and oriented to person, place, and time.  Psychiatric:        Mood and Affect: Mood normal.        Assessment & Plan:  Stephen Conner is a 47 y.o. male . Orchitis and epididymitis  Right testicular pain Suspected mild orchitis/epididymitis.  Unlikely pudendal nerve irritation - not true dysesthesias.  Prior ultrasound, urine testing reassuring and with continued improvement will continue doxycycline for now, RTC precautions given.  Activity modification with riding may be needed temporarily if causing more discomfort in the epididymis/testicle.  All questions answered.  No orders of the defined types were placed in this encounter.  Patient Instructions  Glad you are improving.  Continue antibiotics for the full course, follow-up if symptoms are not continuing to improve or worsen off antibiotics.  Good luck with the new bike saddle.  Take care!    Signed,   Merri Ray, MD [place] 07/01/22 11:08 AM

## 2022-07-15 IMAGING — US US ABDOMEN LIMITED
1 series · 14 of 25 positions shown · non-contrast
Comparison: None.

CLINICAL DATA: Elevated liver function test.

EXAM:
ULTRASOUND ABDOMEN LIMITED RIGHT UPPER QUADRANT

[Series 1: us abdomen limited · 14 of 50 slices shown]
[im 1/50]
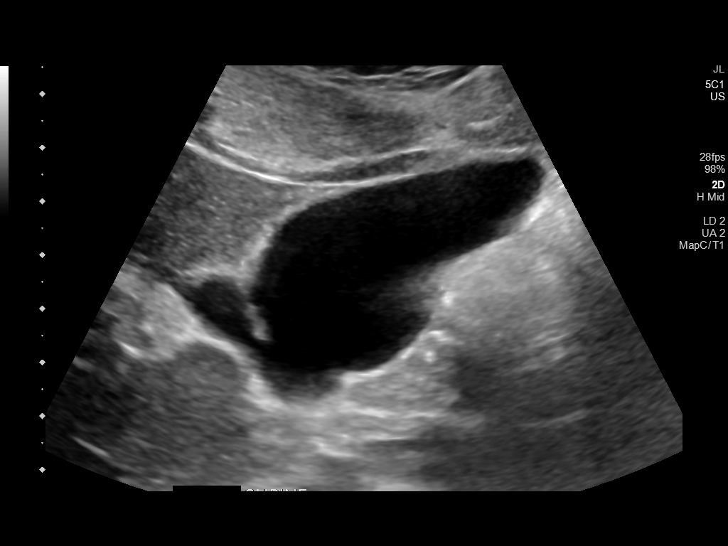
[im 5/50]
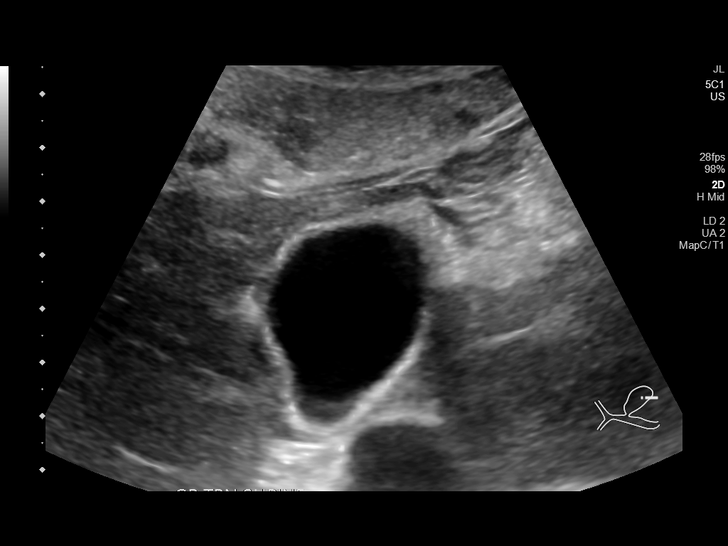
[im 9/50]
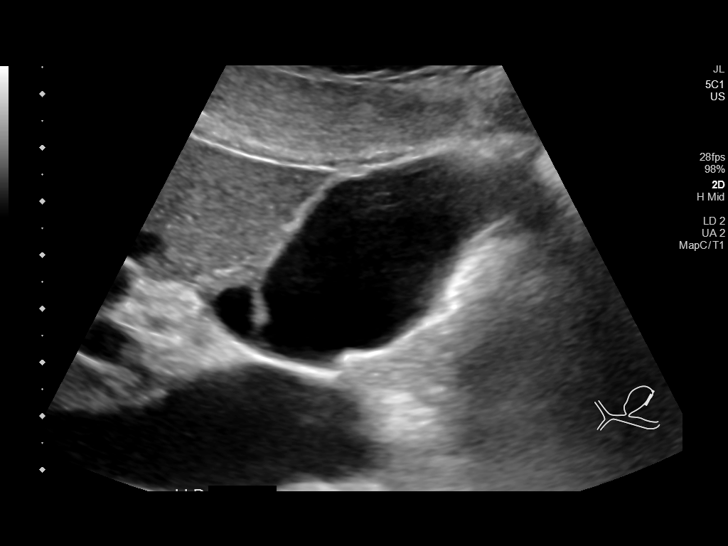
[im 13/50]
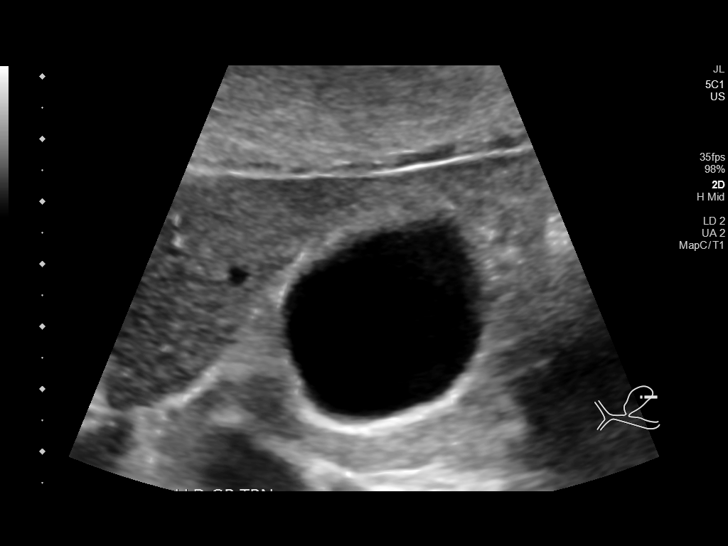
[im 17/50]
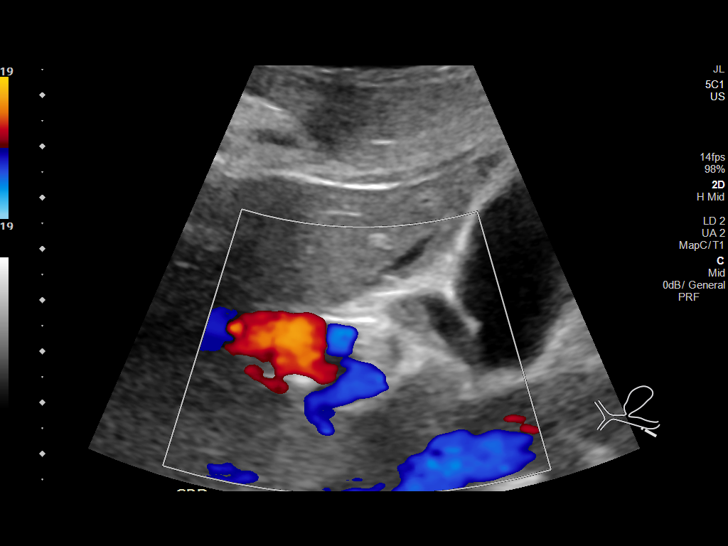
[im 19/50]
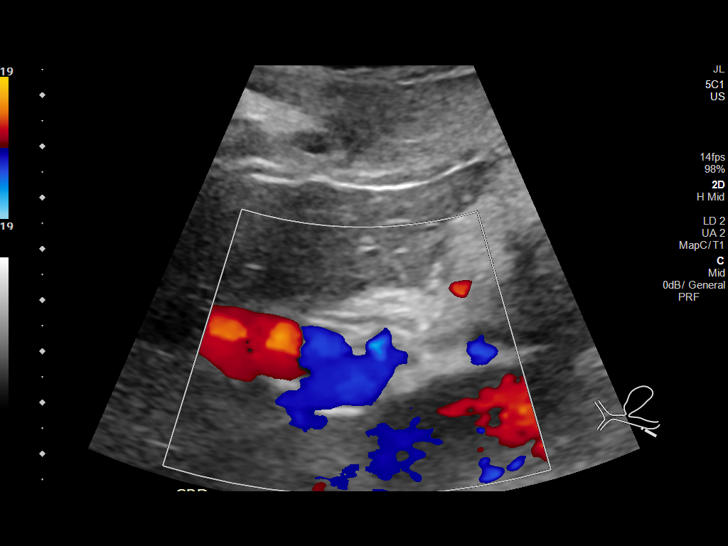
[im 23/50]
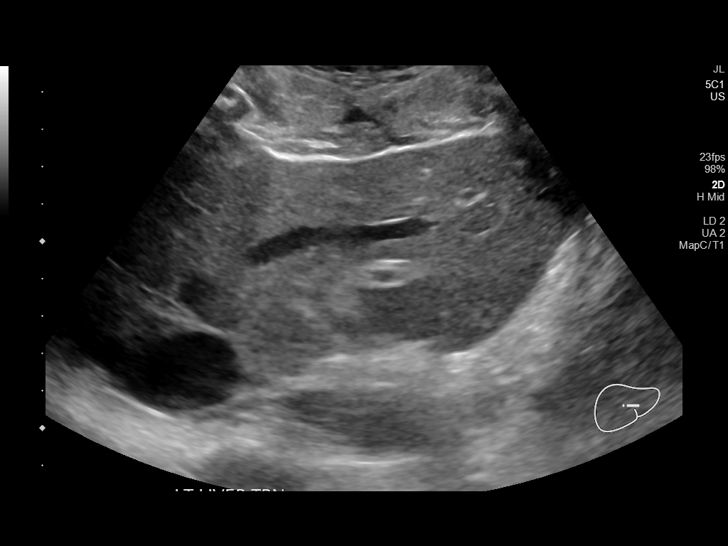
[im 27/50]
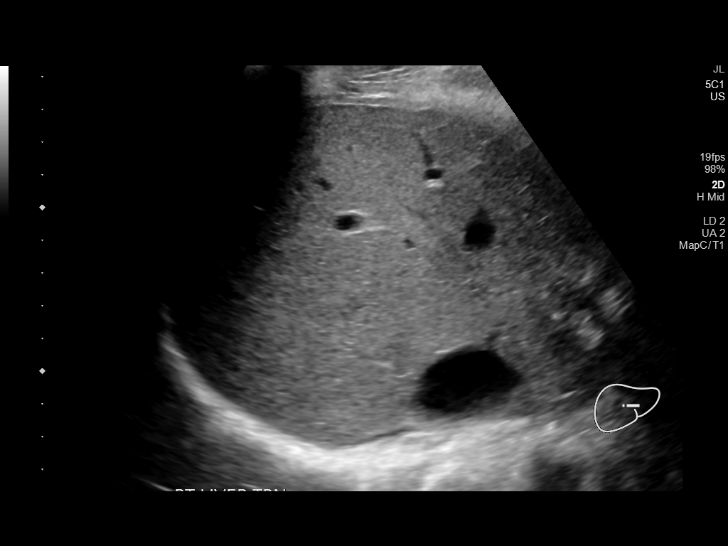
[im 31/50]
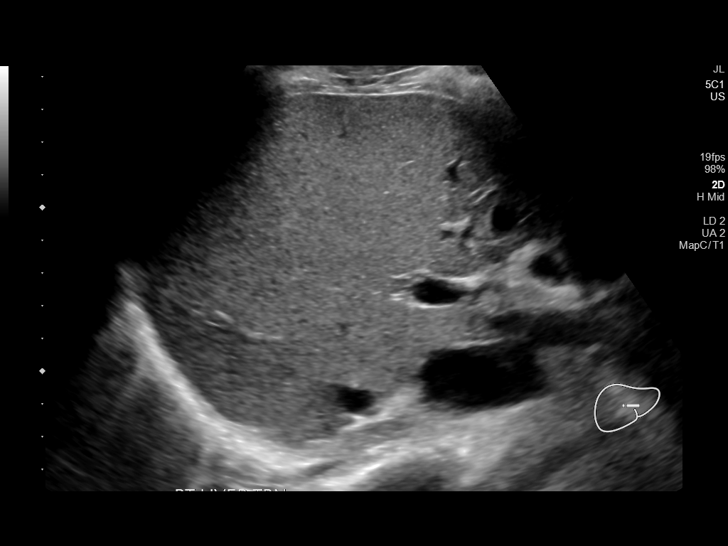
[im 33/50]
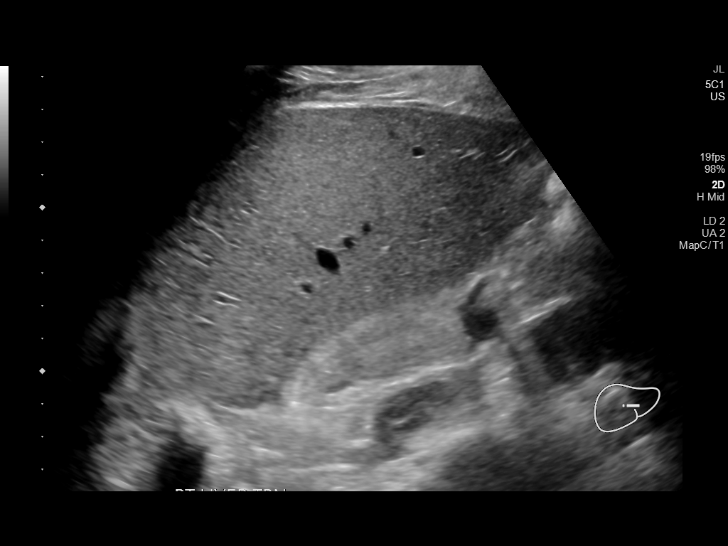
[im 37/50]
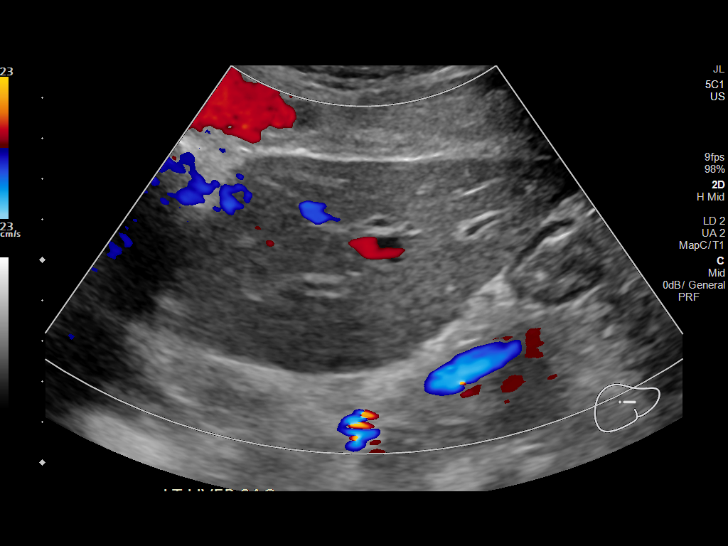
[im 41/50]
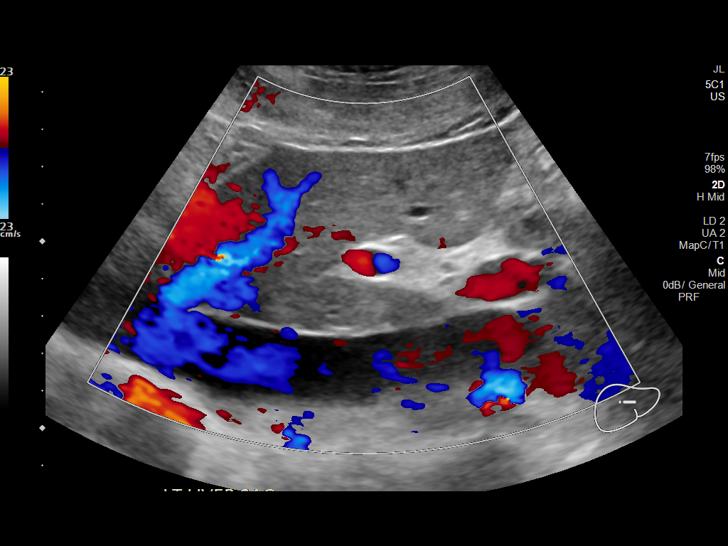
[im 45/50]
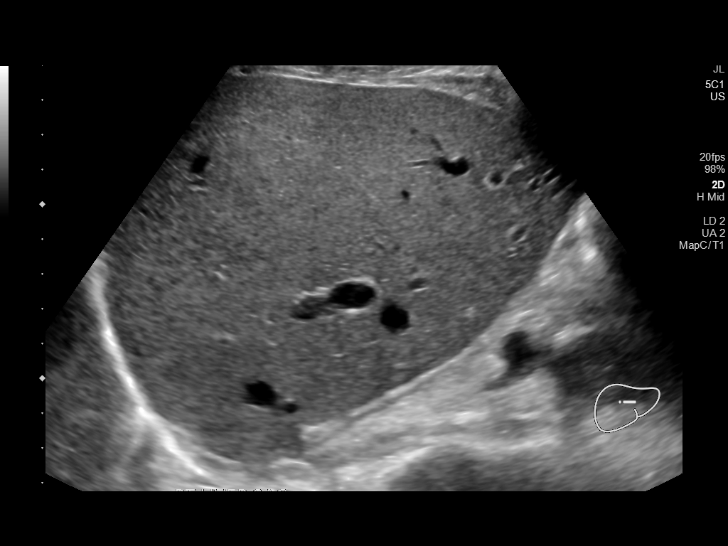
[im 50/50]
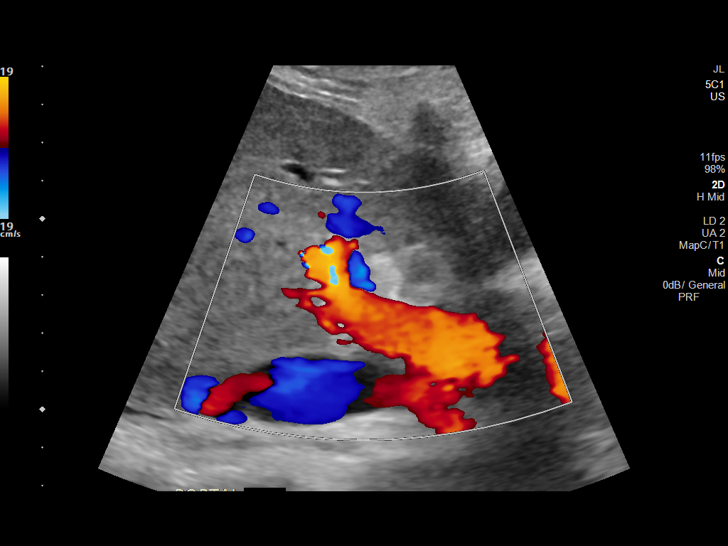

[14 of 25 positions shown; findings below may reference images not displayed]

FINDINGS: Gallbladder:

No gallstones or wall thickening visualized. No sonographic Murphy
sign noted by sonographer.

Common bile duct:

Diameter: 0.2 cm

Liver:

No focal lesion identified. Within normal limits in parenchymal
echogenicity. Portal vein is patent on color Doppler imaging with
normal direction of blood flow towards the liver.

Other: None.
IMPRESSION: Normal exam.

## 2022-12-21 ENCOUNTER — Ambulatory Visit (INDEPENDENT_AMBULATORY_CARE_PROVIDER_SITE_OTHER): Admission: RE | Admit: 2022-12-21 | Payer: 59 | Source: Ambulatory Visit

## 2022-12-21 ENCOUNTER — Ambulatory Visit (INDEPENDENT_AMBULATORY_CARE_PROVIDER_SITE_OTHER): Payer: 59 | Admitting: Family Medicine

## 2022-12-21 ENCOUNTER — Encounter: Payer: Self-pay | Admitting: Family Medicine

## 2022-12-21 VITALS — BP 126/90 | HR 60 | Temp 98.6°F | Ht 70.0 in | Wt 177.6 lb

## 2022-12-21 DIAGNOSIS — L989 Disorder of the skin and subcutaneous tissue, unspecified: Secondary | ICD-10-CM

## 2022-12-21 DIAGNOSIS — M79604 Pain in right leg: Secondary | ICD-10-CM | POA: Diagnosis not present

## 2022-12-21 NOTE — Patient Instructions (Addendum)
Please have x-ray performed at location below.  If any concerns I will let you know.  I would keep the follow-up as planned with dermatology next month in case this area does not resolve.  If any skin changes such as redness, discharge, bruising or new/worsening symptoms be seen sooner.  [facility] or xray: Walk in 8:30-4:30 during weekdays, no appointment needed [address]  [place], [place] [address]

## 2022-12-21 NOTE — Progress Notes (Signed)
Subjective:  Patient ID: Stephen Conner, male    DOB: [birth year]  Age: 47 y.o. MRN: [id number]  CC:  Chief Complaint  Patient presents with   Mass    Lump on Right shin, been there for 3 weeks is tender to the touch. Pt does state there is radiating pain around it. And does sometime cause pain when moving around.    HPI Stephen Conner presents for   Right leg lesion: As above, has noticed a lump on his right shin for approximately past 3 weeks, noticed bump at first, not attached to bone, no bruising, NKI, no rash/skin changes. Dull ache even with walking at times. tender to the touch more sore after pressing on area.  No significant change with ice application. Ibuprofen helps pain, no change in size.   Dermatofibroma on arm years ago - looked different.   Saw derm 1 week prior to noticing this area, [facility]. Next available appt in January, does have appt with PA in October.   He does have a history of rhabdomyolysis in 2021.Cross[employer] training at that time. No new myalgias in calf or swelling.  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 Authorizing Provider  Cholecalciferol (VITAMIN D3)  125 MCG (5000 UT) TABS Take 1 tablet by mouth daily.   Yes [provider]  MAGNESIUM PO Take 1-3 tablets by mouth in the morning and at bedtime. Take 1 tablet in the morning and Take 3 tablets at bedtime   Yes [provider]  Menaquinone-7 (VITAMIN K2 PO) Take 150 mcg by mouth daily.   Yes [provider]  Nutritional Supplements (DHEA PO) Take 15 mg by mouth at bedtime.   Yes [provider]  Omega-3 Fatty Acids (FISH OIL) 300 MG CAPS Take by mouth.   Yes [provider]  Pregnenolone Micronized (PREGNENOLONE PO) Take 150 mg by mouth daily.   Yes [provider]  vitamin B-12 (CYANOCOBALAMIN) 1000 MCG tablet Take 1,000 mcg by mouth daily.   Yes [provider]  ascorbic acid (VITAMIN C) 500 MG tablet Take 1,000 mg by mouth 2 (two) times daily. Patient not taking: Reported on 12/21/2022    [provider]  doxycycline (VIBRA-TABS) 100 MG tablet Take 1 tablet (100 mg total) by mouth 2 (two) times daily. Patient not taking: Reported on 12/21/2022 06/27/22   Shade Flood, MD  methylPREDNISolone (MEDROL DOSEPAK) 4 MG TBPK tablet Take 24 mg on day 1, 20 mg on day 2, 16 mg on day 3, 12 mg on day 4, 8 mg on day 5,  4 mg on day 6. Patient not taking: Reported on 06/24/2022 08/19/21   Theadora Rama Scales, PA-C   Social History   Socioeconomic History   Marital status: Married    Spouse name: Not on file   Number of children: Not on file   Years of education: Not on file   Highest education level: Not on file  Occupational History   Not on file  Tobacco Use   Smoking status: Never   Smokeless tobacco: Never  Vaping Use   Vaping status: Never Used  Substance and Sexual Activity   Alcohol use: Yes    Alcohol/week: 1.0 standard drink of alcohol    Types: 1 Cans of beer per week    Comment: occasional   Drug use: No   Sexual activity: Not on file  Other Topics Concern   Not on file  Social History Narrative   Right Hand    Social Determinants of Health   Financial Resource Strain: Not on file  Food Insecurity: Not on file  Transportation Needs: Not on file  Physical Activity: Not on file  Stress: Not on file  Social Connections: Not on file  Intimate Partner Violence: Not on file    Review of Systems Per HPI.   Objective:   Vitals:   12/21/22 1136  BP: (!) 126/90  Pulse: 60  Temp: 98.6 F (37 C)  TempSrc: Temporal  SpO2: 98%  Weight: 177 lb 9.6 oz (80.6 kg)  Height: 5\' 10"  (1.778 m)     Physical Exam Constitutional:      General: He is not in acute distress.    Appearance: Normal appearance. He is well-developed.  HENT:     Head: Normocephalic and atraumatic.  Cardiovascular:     Rate and Rhythm: Normal rate.  Pulmonary:     Effort: Pulmonary effort is normal.  Skin:      Neurological:     Mental Status: He is alert and oriented to person, place, and time.  Psychiatric:        Mood and Affect: Mood normal.        Assessment & Plan:  Stephen Conner is a 47 y.o. male . Skin lesion of right leg - Plan: DG Tibia/Fibula Right  Right leg pain - Plan: DG Tibia/Fibula Right  Possible contusion but no known injury.  Appears to be skin lesion but slight discomfort towards the tibia.  Will check x-ray but anticipate that will be normal.  He does have follow-up next month with dermatology and recommended keeping that appointment in case this area does not improve.  Avoid manipulation of area at this time, ice if needed, temporary ibuprofen is okay for now with RTC precautions given  No orders of the defined types were placed in this encounter.  Patient Instructions  Please have x-ray performed at location below.  If any concerns I will let you know.  I would keep the follow-up as planned with dermatology next month in case this area does not resolve.  If any skin changes such as redness, discharge, bruising or new/worsening symptoms be seen sooner.  [facility] or  xray: Walk in 8:30-4:30 during weekdays, no appointment needed [address]  [place], [place] [address]     Signed,   Meredith Staggers, MD [facility], [facility] 12/21/22 12:04 PM

## 2022-12-24 ENCOUNTER — Ambulatory Visit
Admission: EM | Admit: 2022-12-24 | Discharge: 2022-12-24 | Disposition: A | Discharge status: Home / Self Care | Payer: 59 | Attending: Internal Medicine | Admitting: Internal Medicine

## 2022-12-24 DIAGNOSIS — J029 Acute pharyngitis, unspecified: Secondary | ICD-10-CM | POA: Insufficient documentation

## 2022-12-24 DIAGNOSIS — B349 Viral infection, unspecified: Secondary | ICD-10-CM | POA: Diagnosis not present

## 2022-12-24 LAB — POCT RAPID STREP A (OFFICE): Rapid Strep A Screen: NEGATIVE

## 2022-12-24 NOTE — ED Triage Notes (Signed)
Pt presents with c/o cough and congestion X 3 days. States he feels tired and has sore throat.

## 2022-12-24 NOTE — Discharge Instructions (Addendum)
The clinic will contact you with the result of the strep throat culture done today if positive.  Lots of rest and fluids.  Over-the-counter Tylenol or ibuprofen as needed.  Salt water gargles and warm liquids.  Please follow-up with your PCP 2 days for recheck.  Please go to the ER for any worsening symptoms.  I hope you feel better soon!

## 2022-12-24 NOTE — ED Provider Notes (Signed)
[facility]    CSN: [id number] Arrival date & time: 12/24/22  1332      History   Chief Complaint Chief Complaint  Patient presents with   Cough   Nasal Congestion    HPI Stephen Conner is a 47 y.o. male  presents for evaluation of URI symptoms for 3 days. Patient reports associated symptoms of cough, congestion, sore throat. Denies N/V/D, fever, ear pain, body aches, shortness of breath. Patient does not have a hx of asthma or smoking.  Reports sick contacts via wife.  Pt has taken nothing OTC for symptoms. Pt has no other concerns at this time.    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 Authorizing Provider  ascorbic acid (VITAMIN C) 500 MG tablet Take 1,000 mg by mouth 2 (two) times daily. Patient not taking: Reported on 12/21/2022    [provider]  Cholecalciferol (VITAMIN D3) 125 MCG (5000 UT) TABS Take 1 tablet by mouth daily.    [provider]  doxycycline (VIBRA-TABS) 100 MG tablet Take 1 tablet (100 mg total) by mouth 2 (two) times daily. Patient not taking: Reported on 12/21/2022 06/27/22   Shade Flood, MD  MAGNESIUM PO Take 1-3 tablets by mouth in the morning and at bedtime. Take 1 tablet in the morning and Take 3 tablets at bedtime    [provider]  Menaquinone-7 (VITAMIN K2 PO) Take 150 mcg by mouth daily.    [provider]  methylPREDNISolone (MEDROL DOSEPAK) 4 MG TBPK  tablet Take 24 mg on day 1, 20 mg on day 2, 16 mg on day 3, 12 mg on day 4, 8 mg on day 5, 4 mg on day 6. Patient not taking: Reported on 06/24/2022 08/19/21   Theadora Rama Scales, PA-C  Nutritional Supplements (DHEA PO) Take 15 mg by mouth at bedtime.    [provider]  Omega-3 Fatty Acids (FISH OIL) 300 MG CAPS Take by mouth.    [provider]  Pregnenolone Micronized (PREGNENOLONE PO) Take 150 mg by mouth daily.    [provider]  vitamin B-12 (CYANOCOBALAMIN) 1000 MCG tablet Take 1,000 mcg by mouth daily.    [provider]    Family History Family History  Problem Relation Age of Onset   Fibromyalgia Mother    Hypertension Mother    Multiple sclerosis Brother    Sleep apnea Father    Other Father        shoulder surgeries   Polycystic ovary syndrome Sister    Other Maternal Grandmother        shingles   Healthy Son    Healthy Son     Social History Social History   Tobacco Use   Smoking status: Never   Smokeless tobacco: Never  Vaping Use   Vaping status: Never Used  Substance  Use Topics   Alcohol use: Yes    Alcohol/week: 1.0 standard drink of alcohol    Types: 1 Cans of beer per week    Comment: occasional   Drug use: No     Allergies   Hydrocodone, Imitrex [sumatriptan], Tramadol, Zonisamide, Amitriptyline, and Gabapentin   Review of Systems Review of Systems  HENT:  Positive for congestion and sore throat.   Respiratory:  Positive for cough.      Physical Exam Triage Vital Signs ED Triage Vitals  Encounter Vitals Group     BP 12/24/22 1410 (!) 144/89     Systolic BP Percentile --      Diastolic BP Percentile --      Pulse Rate 12/24/22 1410 64     Resp 12/24/22 1410 17     Temp 12/24/22 1410 98.9 F (37.2 C)     Temp Source 12/24/22 1410 Oral     SpO2 12/24/22 1410 97 %     Weight --      Height --      Head Circumference --      Peak Flow --      Pain Score 12/24/22 1409 2     Pain Loc --      Pain  Education --      Exclude from Growth Chart --    No data found.  Updated Vital Signs BP (!) 144/89 (BP Location: Right Arm)   Pulse 64   Temp 98.9 F (37.2 C) (Oral)   Resp 17   SpO2 97%   Visual Acuity Right Eye Distance:   Left Eye Distance:   Bilateral Distance:    Right Eye Near:   Left Eye Near:    Bilateral Near:     Physical Exam Vitals and nursing note reviewed.  Constitutional:      General: He is not in acute distress.    Appearance: Normal appearance. He is not ill-appearing or toxic-appearing.  HENT:     Head: Normocephalic and atraumatic.     Right Ear: Tympanic membrane and ear canal normal.     Left Ear: Tympanic membrane and ear canal normal.     Nose: Congestion present.     Mouth/Throat:     Mouth: Mucous membranes are moist.     Pharynx: Posterior oropharyngeal erythema present.  Eyes:     Pupils: Pupils are equal, round, and reactive to light.  Cardiovascular:     Rate and Rhythm: Normal rate and regular rhythm.     Heart sounds: Normal heart sounds.  Pulmonary:     Effort: Pulmonary effort is normal.     Breath sounds: Normal breath sounds.  Musculoskeletal:     Cervical back: Normal range of motion and neck supple.  Lymphadenopathy:     Cervical: No cervical adenopathy.  Skin:    General: Skin is warm and dry.  Neurological:     General: No focal deficit present.     Mental Status: He is alert and oriented to person, place, and time.  Psychiatric:        Mood and Affect: Mood normal.        Behavior: Behavior normal.      UC Treatments / Results  Labs (all labs ordered are listed, but only abnormal results are displayed) Labs Reviewed  CULTURE, GROUP A STREP [facility])  POCT RAPID STREP A (OFFICE)    EKG   Radiology No results found.  Procedures Procedures (including critical care time)  Medications  Ordered in UC Medications - No data to display  Initial Impression / Assessment and Plan / UC Course  I have reviewed the  triage vital signs and the nursing notes.  Pertinent labs & imaging results that were available during my care of the patient were reviewed by me and considered in my medical decision making (see chart for details).     Reviewed exam and symptoms with patient.  No red flags.  Negative rapid strep, will culture.  Patient declined COVID testing.  Discussed viral illness and symptomatic treatment.  PCP follow-up 2 days for recheck.  ER precautions reviewed and patient verbalized understanding. Final Clinical Impressions(s) / UC Diagnoses   Final diagnoses:  Sore throat  Viral illness     Discharge Instructions      The clinic will contact you with the result of the strep throat culture done today if positive.  Lots of rest and fluids.  Over-the-counter Tylenol or ibuprofen as needed.  Salt water gargles and warm liquids.  Please follow-up with your PCP 2 days for recheck.  Please go to the ER for any worsening symptoms.  I hope you feel better soon!    ED Prescriptions   None    PDMP not reviewed this encounter.   Radford Pax, NP 12/24/22 [phone]

## 2022-12-27 LAB — CULTURE, GROUP A STREP (THRC)

## 2023-06-16 ENCOUNTER — Encounter: Payer: Self-pay | Admitting: Family Medicine

## 2023-06-16 ENCOUNTER — Ambulatory Visit (INDEPENDENT_AMBULATORY_CARE_PROVIDER_SITE_OTHER): Payer: 59 | Admitting: Family Medicine

## 2023-06-16 VITALS — BP 122/72 | HR 67 | Ht 70.0 in | Wt 179.2 lb

## 2023-06-16 DIAGNOSIS — Z8739 Personal history of other diseases of the musculoskeletal system and connective tissue: Secondary | ICD-10-CM | POA: Insufficient documentation

## 2023-06-16 DIAGNOSIS — K219 Gastro-esophageal reflux disease without esophagitis: Secondary | ICD-10-CM | POA: Insufficient documentation

## 2023-06-16 MED ORDER — OMEPRAZOLE 40 MG PO CPDR: 40.0000 mg | DELAYED_RELEASE_CAPSULE | Freq: Every day | ORAL | 3 refills | Status: AC

## 2023-06-16 NOTE — Assessment & Plan Note (Signed)
 New.  Pt reports he has had sxs for 18 months but they are now occurring daily.  He has tried eliminating things from his diet, changing supplements.  Will start Omeprazole 40mg  daily and check for H Pylori.  Pt to f/u w/ PCP to determine if sxs have improved or GI referral is needed.  Pt expressed understanding and is in agreement w/ plan.

## 2023-06-16 NOTE — Progress Notes (Signed)
   Subjective:    Patient ID: Stephen Conner, male    DOB: [birth year], 48 y.o.   MRN: [id number]  HPI GERD- pt reports sxs started ~18 mo ago.  Pt reports sxs are now occurring daily.  Has tried changing supplements, diet, exercise patterns.  Has eliminated coffee.  2 nights ago had sour brash as well as chest pain and 'constant burning'.  'i don't just want a couple of pills, I want to know what's going on'.     Review of Systems For ROS see HPI     Objective:   Physical Exam Constitutional:      General: He is not in acute distress.    Appearance: Normal appearance. He is well-developed.  HENT:     Head: Normocephalic and atraumatic.  Eyes:     Extraocular Movements: Extraocular movements intact.     Conjunctiva/sclera: Conjunctivae normal.     Pupils: Pupils are equal, round, and reactive to light.  Neck:     Thyroid: No thyromegaly.  Cardiovascular:     Rate and Rhythm: Normal rate and regular rhythm.     Pulses: Normal pulses.     Heart sounds: Normal heart sounds. No murmur heard. Pulmonary:     Effort: Pulmonary effort is normal. No respiratory distress.     Breath sounds: Normal breath sounds.  Abdominal:     General: Bowel sounds are normal. There is no distension.     Palpations: Abdomen is soft.  Musculoskeletal:     Cervical back: Normal range of motion and neck supple.     Right lower leg: No edema.     Left lower leg: No edema.  Lymphadenopathy:     Cervical: No cervical adenopathy.  Skin:    General: Skin is warm and dry.  Neurological:     General: No focal deficit present.     Mental Status: He is alert and oriented to person, place, and time.     Cranial Nerves: No cranial nerve deficit.  Psychiatric:        Mood and Affect: Mood normal.        Behavior: Behavior normal.           Assessment & Plan:

## 2023-06-16 NOTE — Patient Instructions (Signed)
 Follow up in 6 weeks to recheck reflux We'll notify you of your lab results and make any changes if needed START the Omeprazole daily to decrease acid production and reflux Try and avoid eating before bed/lying down Call with any questions or concerns Stay Safe!  Stay Healthy! Hang in there!

## 2023-06-19 LAB — H. PYLORI BREATH TEST: H. pylori Breath Test: NOT DETECTED

## 2023-06-19 NOTE — Telephone Encounter (Signed)
 Patient is having concerns with side effects from omeprazole that was recently prescribed. Patient is wondering if he should continue the medication?

## 2023-06-20 ENCOUNTER — Encounter: Payer: Self-pay | Admitting: Family Medicine

## 2023-07-28 ENCOUNTER — Encounter: Payer: Self-pay | Admitting: Family Medicine

## 2023-07-28 ENCOUNTER — Ambulatory Visit: Payer: 59 | Admitting: Family Medicine

## 2023-07-28 VITALS — BP 126/74 | HR 78 | Temp 98.4°F | Ht 70.0 in | Wt 181.4 lb

## 2023-07-28 DIAGNOSIS — S80812A Abrasion, left lower leg, initial encounter: Secondary | ICD-10-CM | POA: Diagnosis not present

## 2023-07-28 DIAGNOSIS — K219 Gastro-esophageal reflux disease without esophagitis: Secondary | ICD-10-CM

## 2023-07-28 NOTE — Progress Notes (Signed)
 Subjective:  Patient ID: Stephen Conner, male    DOB: 1976/02/15  Age: 48 y.o. MRN: [id number]  CC:  Chief Complaint  Patient presents with   Gastroesophageal Reflux    Pt is doing okay, no real questions about this    Abrasion    Pt notes was scratched by dog on his Upper Lt thigh and though it is healed he has a hard knot under the scratch little tender when pushed on     HPI Stephen Conner presents for   Gastroesophageal reflux Chart reviewed, he was seen by my colleague on February 28.  Note reviewed, symptoms for approximately 18 months but had been occurring daily at that time.  Had tried change in supplements, dietary lamination.  Started omeprazole 40 mg daily, and H. pylori testing performed, which was negative.  See MyChart messages subsequently.  Concerned about dark urine initially, took one dose, then dark urine cleared afte a few days. Did work well for heartburn. He was concerned given prior history of rhabdo.  Recommended to temporarily hold meds and increase fluids.  RTC precautions were given.  He was switched to Pepcid 20 mg daily. Took for 30 days, some breakthrough heartburn at time of dose due - mild symptoms. Tried to taper off this week - more heartburn.  Still drinking coffee, tea - 4 cups total. Has cut back on coffee - prior 4 cups.   Denies nausea, vomiting, no hematemesis, no melena, hematochezia or abdominal pain currently.  No unexplained weight loss, night sweats or fevers.  L leg abrasion: 6 weeks ago neighbor's dog scratched left thigh, with some bruising in area. Overall better, bruising mostly gone, but still small scar tissue line. No redness, slight ttp. Firm area in center.     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/01/2019   Hypokalemia 11/01/2019   Testosterone deficiency 11/01/2019   Allergic urticaria 05/24/2016   Seasonal and perennial allergic rhinitis 05/24/2016   Seasonal and perennial allergic rhinitis 05/24/2016   Past Medical History:  Diagnosis Date   Asthma    As a child   Migraine    Rhabdomyolysis    Urticaria    Past Surgical History:  Procedure Laterality Date   SHOULDER SURGERY     Right x 2    Allergies  Allergen Reactions   Hydrocodone Itching   Imitrex [Sumatriptan] Anaphylaxis   Tramadol Other (See Comments)    twitchy   Zonisamide Rash   Amitriptyline Other (See Comments)    Per pt - no mood   Gabapentin Tinitus   Prior to Admission medications   Medication Sig Start Date End Date Taking? Authorizing Provider  Cholecalciferol (VITAMIN D3) 125 MCG (5000 UT) TABS Take 1 tablet by mouth daily.   Yes [provider]  CREATINE PO Take by mouth.   Yes [provider]  MAGNESIUM PO Take 1-3 tablets by mouth in the morning and at bedtime. Take 1 tablet in the morning and Take 3 tablets at bedtime   Yes [provider]  meloxicam (MOBIC) 7.5 MG tablet Take 7.5 mg by mouth daily.   Yes [provider]  Menaquinone-7 (VITAMIN K2 PO) Take 150 mcg by mouth daily.   Yes [provider]  Nutritional Supplements (DHEA PO) Take 15 mg  by mouth at bedtime.   Yes [provider]  omeprazole (PRILOSEC) 40 MG capsule Take 1 capsule (40 mg total) by mouth daily. 06/16/23  Yes Sheliah Hatch, MD  Pregnenolone Micronized (PREGNENOLONE PO) Take 150 mg by mouth daily.   Yes [provider]  vitamin B-12 (CYANOCOBALAMIN) 1000 MCG tablet Take 1,000 mcg by mouth daily.   Yes [provider]   Social History   Socioeconomic History   Marital status: Married    Spouse name: Not on file   Number of children: Not on file   Years of education: Not on file   Highest education level: Bachelor's degree (e.g., BA,  AB, BS)  Occupational History   Not on file  Tobacco Use   Smoking status: Never   Smokeless tobacco: Never  Vaping Use   Vaping status: Never Used  Substance and Sexual Activity   Alcohol use: Yes    Alcohol/week: 1.0 standard drink of alcohol    Types: 1 Cans of beer per week    Comment: occasional   Drug use: No   Sexual activity: Not on file  Other Topics Concern   Not on file  Social History Narrative   Right Hand   Social Drivers of Health   Financial Resource Strain: Low Risk  (06/15/2023)   Overall Financial Resource Strain (CARDIA)    Difficulty of Paying Living Expenses: Not hard at all  Food Insecurity: No Food Insecurity (06/15/2023)   Hunger Vital Sign    Worried About Running Out of Food in the Last Year: Never true    Ran Out of Food in the Last Year: Never true  Transportation Needs: No Transportation Needs (06/15/2023)   PRAPARE - Administrator, Civil Service (Medical): No    Lack of Transportation (Non-Medical): No  Physical Activity: Insufficiently Active (06/15/2023)   Exercise Vital Sign    Days of Exercise per Week: 7 days    Minutes of Exercise per Session: 20 min  Stress: No Stress Concern Present (06/15/2023)   [employer] of Occupational Health - Occupational Stress Questionnaire    Feeling of Stress : Not at all  Social Connections: Socially Integrated (06/15/2023)   Social Connection and Isolation Panel [NHANES]    Frequency of Communication with Friends and Family: Three times a week    Frequency of Social Gatherings with Friends and Family: Twice a week    Attends Religious Services: More than 4 times per year    Active Member of [employer] or Organizations: Yes    Attends Engineer, structural: More than 4 times per year    Marital Status: Married  Catering manager Violence: Not on file    Review of Systems   Objective:   Vitals:   07/28/23 0937  BP: 126/74  Pulse: 78  Temp: 98.4 F (36.9 C)  TempSrc: Temporal   SpO2: 99%  Weight: 181 lb 6.4 oz (82.3 kg)  Height: 5\' 10"  (1.778 m)     Physical Exam Vitals reviewed.  Constitutional:      Appearance: He is well-developed.  HENT:     Head: Normocephalic and atraumatic.  Neck:     Vascular: No carotid bruit or JVD.  Cardiovascular:     Rate and Rhythm: Normal rate and regular rhythm.     Heart sounds: Normal heart sounds. No murmur heard. Pulmonary:     Effort: Pulmonary effort is normal.     Breath sounds: Normal breath sounds. No rales.  Abdominal:     General: Abdomen is flat. Bowel sounds are normal. There is no distension.     Tenderness: There is no abdominal tenderness.  Musculoskeletal:     Right lower leg: No edema.     Left lower leg: No edema.  Skin:    General: Skin is warm and dry.     Comments: Left anterior thigh, healed abrasion, with small area of firmness under the skin approximately half to 2 cm.  Minimal discomfort.  No surrounding ecchymosis or erythema.  Neurological:     Mental Status: He is alert and oriented to person, place, and time.  Psychiatric:        Mood and Affect: Mood normal.     Assessment & Plan:  Stephen Conner is a 48 y.o. male . Gastroesophageal reflux disease, unspecified whether esophagitis present  - Improved with Pepcid but still some breakthrough symptoms, will try twice daily dosing.  Continue to avoid triggers, handout given.  If persistent need for meds will consider GI eval in the next 6 weeks or so.  He will let me know.  If incomplete treatment on H2 blocker, could consider repeat trial of PPI with close monitoring for any urinary changes or other new side effects.  Will hold on that change for now.  RTC precautions given.  Abrasion of left lower extremity, initial encounter  - Improved, small area of firmness underneath the skin likely area of scar tissue, less likely heterotopic ossification from prior ecchymosis.  Symptomatic care with warm compresses, gentle massage of area and  RTC precautions given.  No orders of the defined types were placed in this encounter.  Patient Instructions  Try famotidine twice per day for heartburn. Continue to try to avoid triggers. If that is not effective, we can try a proton pump inhibitor again and watch for any urinary symptoms. Let me know.   If this continues to be an issue in next 6 weeks, let me know and I can refer you to gastroenterology.   Firm area on left thigh could be a small area of scar tissue.  Okay to apply warm compress if needed, gentle massage of area may loosen up some of the tissue.  If any worsening or not improving in the next 6 to 8 weeks, let me know.   GERD in Adults: Diet Changes When you have gastroesophageal reflux disease (GERD), you may need to make changes to your diet. Choosing the right foods can help with your symptoms. Think about working with an expert in healthy eating called a dietitian. They can help you make healthy food choices. What are tips for following this plan? Reading food labels Look for foods that are low in saturated fat. Foods that may help with your symptoms include: Foods with less than 5% of daily value (DV) of fat. Foods with 0 grams of trans fat. Cooking [employer] in ways that don't use a lot of fat. These ways include: Baking. Steaming. Grilling. Broiling. To add flavor, try to use herbs that are low in spice and acidity. Avoid frying your food. Meal planning  Eat small meals often rather than eating 3 large meals each day. Eat your meals slowly in a place where you feel relaxed. If told by your health care provider, avoid: Foods that cause symptoms. Keep a food diary to keep track of foods that cause symptoms. Alcohol. Drinking a lot of liquid with meals. General instructions For 2-3 hours after you eat, avoid: Bending  over. Exercise. Lying down. Chew sugar-free gum after meals. What foods should I eat? Eat a healthy diet. Try to include: Foods with  high amounts of fiber. These include: Fruits and vegetables. Whole grains and beans. Low-fat dairy products. Lean meats, fish, and poultry. Egg whites. Foods that cause symptoms in someone else may not cause symptoms for you. Work with your provider to find foods that are safe for you. The items listed above may not be all the foods and drinks you can have. Talk with a dietitian to learn more. The items listed above may not be a complete list of foods and beverages you can eat and drink. Contact a dietitian for more information. What foods should I avoid? Limiting some of these foods may help with your symptoms. Each person is different. Talk with a dietitian or your provider to help you find the exact foods to avoid. Some of the foods to avoid may include: Fruits Fruits with a lot of acid in them. These may include citrus fruits, such as oranges, grapefruit, pineapple, and lemons. Vegetables Deep-fried vegetables, such as [place] fries. Vegetables, sauces, or toppings made with added fat and vegetables with acid in them. These may include tomatoes and tomato products, chili peppers, onions, garlic, and horseradish. Grains Pastries or quick breads with added fat. Meats and other proteins High-fat meats, such as fatty beef or pork, hot dogs, ribs, ham, sausage, salami, and bacon. Fried meat or protein, such as fried fish and fried chicken. Egg yolks. Fats and oils Butter. Margarine. Shortening. Ghee. Drinks Coffee and other drinks with caffeine in them. Fizzy and sugary drinks, such as soda and energy drinks. Fruit juice made with acidic fruits, such as orange or grapefruit. Tomato juice. Sweets and desserts Chocolate and cocoa. Donuts. Seasonings and condiments Mint, such as peppermint and spearmint. Condiments, herbs, or seasonings that cause symptoms. These may include curry, hot sauce, or vinegar-based salad dressings. The items listed above may not be all the foods and drinks you  should avoid. Talk with a dietitian to learn more. Questions to ask your health care provider Changes to your diet and everyday life are often the first steps taken to manage symptoms of GERD. If these changes don't help, talk with your provider about taking medicines. Where to find more information International Foundation for Gastrointestinal Disorders: aboutgerd.org This information is not intended to replace advice given to you by your health care provider. Make sure you discuss any questions you have with your health care provider. Document Revised: 02/14/2023 Document Reviewed: 08/31/2022 Elsevier Patient Education  2024 Elsevier Inc.     Signed,   Meredith Staggers, MD [facility], [facility] 07/28/23 10:16 AM

## 2023-07-28 NOTE — Patient Instructions (Addendum)
 Try famotidine twice per day for heartburn. Continue to try to avoid triggers. If that is not effective, we can try a proton pump inhibitor again and watch for any urinary symptoms. Let me know.   If this continues to be an issue in next 6 weeks, let me know and I can refer you to gastroenterology.   Firm area on left thigh could be a small area of scar tissue.  Okay to apply warm compress if needed, gentle massage of area may loosen up some of the tissue.  If any worsening or not improving in the next 6 to 8 weeks, let me know.   GERD in Adults: Diet Changes When you have gastroesophageal reflux disease (GERD), you may need to make changes to your diet. Choosing the right foods can help with your symptoms. Think about working with an expert in healthy eating called a dietitian. They can help you make healthy food choices. What are tips for following this plan? Reading food labels Look for foods that are low in saturated fat. Foods that may help with your symptoms include: Foods with less than 5% of daily value (DV) of fat. Foods with 0 grams of trans fat. Cooking [employer] in ways that don't use a lot of fat. These ways include: Baking. Steaming. Grilling. Broiling. To add flavor, try to use herbs that are low in spice and acidity. Avoid frying your food. Meal planning  Eat small meals often rather than eating 3 large meals each day. Eat your meals slowly in a place where you feel relaxed. If told by your health care provider, avoid: Foods that cause symptoms. Keep a food diary to keep track of foods that cause symptoms. Alcohol. Drinking a lot of liquid with meals. General instructions For 2-3 hours after you eat, avoid: Bending over. Exercise. Lying down. Chew sugar-free gum after meals. What foods should I eat? Eat a healthy diet. Try to include: Foods with high amounts of fiber. These include: Fruits and vegetables. Whole grains and beans. Low-fat dairy  products. Lean meats, fish, and poultry. Egg whites. Foods that cause symptoms in someone else may not cause symptoms for you. Work with your provider to find foods that are safe for you. The items listed above may not be all the foods and drinks you can have. Talk with a dietitian to learn more. The items listed above may not be a complete list of foods and beverages you can eat and drink. Contact a dietitian for more information. What foods should I avoid? Limiting some of these foods may help with your symptoms. Each person is different. Talk with a dietitian or your provider to help you find the exact foods to avoid. Some of the foods to avoid may include: Fruits Fruits with a lot of acid in them. These may include citrus fruits, such as oranges, grapefruit, pineapple, and lemons. Vegetables Deep-fried vegetables, such as [place] fries. Vegetables, sauces, or toppings made with added fat and vegetables with acid in them. These may include tomatoes and tomato products, chili peppers, onions, garlic, and horseradish. Grains Pastries or quick breads with added fat. Meats and other proteins High-fat meats, such as fatty beef or pork, hot dogs, ribs, ham, sausage, salami, and bacon. Fried meat or protein, such as fried fish and fried chicken. Egg yolks. Fats and oils Butter. Margarine. Shortening. Ghee. Drinks Coffee and other drinks with caffeine in them. Fizzy and sugary drinks, such as soda and energy drinks. Fruit juice made with  acidic fruits, such as orange or grapefruit. Tomato juice. Sweets and desserts Chocolate and cocoa. Donuts. Seasonings and condiments Mint, such as peppermint and spearmint. Condiments, herbs, or seasonings that cause symptoms. These may include curry, hot sauce, or vinegar-based salad dressings. The items listed above may not be all the foods and drinks you should avoid. Talk with a dietitian to learn more. Questions to ask your health care provider Changes to  your diet and everyday life are often the first steps taken to manage symptoms of GERD. If these changes don't help, talk with your provider about taking medicines. Where to find more information International Foundation for Gastrointestinal Disorders: aboutgerd.org This information is not intended to replace advice given to you by your health care provider. Make sure you discuss any questions you have with your health care provider. Document Revised: 02/14/2023 Document Reviewed: 08/31/2022 Elsevier Patient Education  2024 [employer].

## 2023-12-08 ENCOUNTER — Encounter: Payer: Self-pay | Admitting: Family Medicine

## 2023-12-08 DIAGNOSIS — R0683 Snoring: Secondary | ICD-10-CM

## 2023-12-08 NOTE — Telephone Encounter (Signed)
 Would you like for patient to come in to discuss or place order for sleep study if appropriate?

## 2023-12-08 NOTE — Addendum Note (Signed)
 Addended by: Lanijah Warzecha R on: 12/08/2023 03:59 PM   Modules accepted: Orders

## 2023-12-08 NOTE — Telephone Encounter (Signed)
 Patient replied and said   I don't have a preference. I don't think I've been a patient at either one before.

## 2024-01-22 ENCOUNTER — Ambulatory Visit (INDEPENDENT_AMBULATORY_CARE_PROVIDER_SITE_OTHER): Admitting: Neurology

## 2024-01-22 ENCOUNTER — Encounter: Payer: Self-pay | Admitting: Neurology

## 2024-01-22 VITALS — BP 120/72 | HR 66 | Ht 70.0 in | Wt 184.0 lb

## 2024-01-22 DIAGNOSIS — R0681 Apnea, not elsewhere classified: Secondary | ICD-10-CM

## 2024-01-22 DIAGNOSIS — R0683 Snoring: Secondary | ICD-10-CM | POA: Diagnosis not present

## 2024-01-22 DIAGNOSIS — R351 Nocturia: Secondary | ICD-10-CM

## 2024-01-22 DIAGNOSIS — Z820 Family history of epilepsy and other diseases of the nervous system: Secondary | ICD-10-CM

## 2024-01-22 DIAGNOSIS — G4719 Other hypersomnia: Secondary | ICD-10-CM | POA: Diagnosis not present

## 2024-01-22 DIAGNOSIS — R519 Headache, unspecified: Secondary | ICD-10-CM

## 2024-01-22 DIAGNOSIS — Z9189 Other specified personal risk factors, not elsewhere classified: Secondary | ICD-10-CM

## 2024-01-22 NOTE — Patient Instructions (Signed)

## 2024-01-22 NOTE — Progress Notes (Signed)
 Subjective:    Patient ID: Stephen Conner is a 48 y.o. male.  HPI    True Mar, MD, PhD [facility] [address], [address] [address] [place], [place] [address]  Dear Dr. Levora,   I saw your patient, Stephen Conner, upon your kind request in my sleep clinic today for initial consultation of his sleep disorder, in particular, concern for underlying obstructive sleep apnea.  The patient is unaccompanied today.  As you know, Stephen Conner is a 48 year old male with an underlying medical history of reflux disease, asthma, history of rhabdomyolysis, urticaria, migraine headaches and mildly overweight state, who reports snoring and excessive daytime somnolence as well as witnessed apneas, per wife's report and he reports a family history of sleep apnea affecting both parents.  His mom has a PAP machine, his dad had surgery for sleep apnea.  His Epworth sleepiness score is 5 out of 24, fatigue severity score is 25 out of 63.  I reviewed your office note from 07/28/2023.   He does not wake up rested.  He drinks caffeine  in the form of tea in the morning, about 2 cups and 2 cups of coffee in the afternoons, typically no later than 4:00.  He does not have a TV in the bedroom, they use a white noise machine in the bedroom.  Bedtime is generally around 11 and rise time around 6.  He has had occasional morning headaches and has nocturia about once per average night.  He lives with his wife, his 2 sons are grown.  They have a dog in the household.   He is a non-smoker and drinks alcohol occasionally.  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    His Past Medical History Is Significant For: Past Medical History:  Diagnosis Date   Asthma    As a child   Migraine    Rhabdomyolysis    Urticaria     His Past Surgical History Is Significant For: Past Surgical History:  Procedure Laterality Date   SHOULDER SURGERY     Right x 2     His Family History Is Significant For: Family  History  Problem Relation Age of Onset   Fibromyalgia Mother    Hypertension Mother    Multiple sclerosis Brother    Sleep apnea Father    Other Father        shoulder surgeries   Polycystic ovary syndrome Sister    Other Maternal Grandmother        shingles   Healthy Son    Healthy Son     His Social History Is Significant For: Social History   Socioeconomic History   Marital status: Married    Spouse name: Not on file   Number of children: Not on file   Years of education: Not on file   Highest education level: Bachelor's degree (e.g., BA, AB, BS)  Occupational History   Not on file  Tobacco Use   Smoking status: Never   Smokeless tobacco: Never  Vaping Use   Vaping status: Never Used  Substance and Sexual Activity   Alcohol use: Yes    Alcohol/week: 2.0 standard drinks of alcohol    Types: 1 Cans of beer, 1 Shots of liquor per week    Comment: occasional   Drug use: No   Sexual activity: Not on file  Other Topics Concern   Not on file  Social History Narrative   Right Hand      Coffee  2 cups daily and tea    Social Drivers of Health   Financial Resource Strain: Low Risk  (06/15/2023)   Overall Financial Resource Strain (CARDIA)    Difficulty of Paying Living Expenses: Not hard at all  Food Insecurity: No Food Insecurity (06/15/2023)   Hunger Vital Sign    Worried About Running Out of Food in the Last Year: Never true    Ran Out of Food in the Last Year: Never true  Transportation Needs: No Transportation Needs (06/15/2023)   PRAPARE - Administrator, Civil Service (Medical): No    Lack of Transportation (Non-Medical): No  Physical Activity: Insufficiently Active (06/15/2023)   Exercise Vital Sign    Days of Exercise per Week: 7 days    Minutes of Exercise per Session: 20 min  Stress: No Stress Concern Present (06/15/2023)   [employer] of Occupational Health - Occupational Stress Questionnaire    Feeling of Stress : Not at all  Social  Connections: Socially Integrated (06/15/2023)   Social Connection and Isolation Panel    Frequency of Communication with Friends and Family: Three times a week    Frequency of Social Gatherings with Friends and Family: Twice a week    Attends Religious Services: More than 4 times per year    Active Member of [employer] or Organizations: Yes    Attends Engineer, structural: More than 4 times per year    Marital Status: Married    His Allergies Are:  Allergies  Allergen Reactions   Hydrocodone Itching   Imitrex [Sumatriptan] Anaphylaxis   Tramadol Other (See Comments)    twitchy   Zonisamide Rash   Amitriptyline Other (See Comments)    Per pt - no mood   Gabapentin  Tinitus  :   His Current Medications Are:  Outpatient Encounter Medications as of 01/22/2024  Medication Sig   Cholecalciferol (VITAMIN D3) 125 MCG (5000 UT) TABS Take 1 tablet by mouth daily.   CREATINE PO Take by mouth.   MAGNESIUM PO Take 1-3 tablets by mouth in the morning and at bedtime. Take 1 tablet in the morning and Take 3 tablets at bedtime   Menaquinone-7 (VITAMIN K2 PO) Take 150 mcg by mouth daily.   Pregnenolone Micronized (PREGNENOLONE PO) Take 150 mg by mouth daily.   vitamin B-12 (CYANOCOBALAMIN) 1000 MCG tablet Take 1,000 mcg by mouth daily.   meloxicam (MOBIC) 7.5 MG tablet Take 7.5 mg by mouth daily. (Patient not taking: Reported on 01/22/2024)   Nutritional Supplements (DHEA PO) Take 15 mg by mouth at bedtime.   omeprazole  (PRILOSEC) 40 MG capsule Take 1 capsule (40 mg total) by mouth daily. (Patient not taking: Reported on 01/22/2024)   No facility-administered encounter medications on file as of 01/22/2024.  :   Review of Systems:  Out of a complete 14 point review of systems, all are reviewed and negative with the exception of these symptoms as listed below:   Review of Systems  Neurological:        Patient is here for snoring  and waking up frequently. Some choking sleep more due to acid  reflux.  ESS -5  FSS- 25    Objective:  Neurological Exam  Physical Exam Physical Examination:   Vitals:   01/22/24 1534  BP: 120/72  Pulse: 66  SpO2: 98%    General Examination: The patient is a very pleasant 48 y.o. male in no acute distress. He appears well-developed and well-nourished and well groomed.  HEENT: Normocephalic, atraumatic, pupils are equal, round and reactive to light, extraocular tracking is good without limitation to gaze excursion or nystagmus noted. No photophobia.  Hearing is grossly intact.  Face is symmetric with normal facial animation. Speech is clear without dysarthria. There is no hypophonia. There is no lip, neck/head, jaw or voice tremor. Neck is supple with full range of passive and active motion. There are no carotid bruits on auscultation.  Airway/Oropharynx exam reveals: No significant mouth dryness, good dental hygiene, moderate airway crowding secondary to small airway entry, tonsils on the smaller side, slightly prominent uvula and slightly thicker tongue.  Tongue protrudes centrally and palate elevates symmetrically, Mallampati class III.   Chest: Clear to auscultation without wheezing, rhonchi or crackles noted.  Heart: S1+S2+0, regular and normal without murmurs, rubs or gallops noted.   Abdomen: Soft, non-tender and non-distended.  Extremities: There is no pitting edema in the distal lower extremities bilaterally.   Skin: Warm and dry without trophic changes noted.   Musculoskeletal: exam reveals no obvious joint deformities.   Neurologically:  Mental status: The patient is awake, alert and oriented in all 4 spheres. His immediate and remote memory, attention, language skills and fund of knowledge are appropriate. There is no evidence of aphasia, agnosia, apraxia or anomia. Speech is clear with normal prosody and enunciation. Thought process is linear. Mood is normal and affect is normal.  Cranial nerves II - XII are as described above  under HEENT exam.  Motor exam: Normal bulk, moving all 4 extremities without restriction, no obvious action or resting tremor.  Fine motor skills and coordination: Intact grossly.  Cerebellar testing: No dysmetria or intention tremor. There is no truncal or gait ataxia.  Sensory exam: intact to light touch in the upper and lower extremities.  Gait, station and balance: He stands easily. No veering to one side is noted. No leaning to one side is noted. Posture is age-appropriate and stance is narrow based. Gait shows normal stride length and normal pace. No problems turning are noted.   Assessment and plan:  In summary, Salah Nakamura is a very pleasant 48 y.o.-year old male with an underlying medical history of reflux disease, asthma, history of rhabdomyolysis, urticaria, migraine headaches and mildly overweight state, whose history and physical exam are concerning for sleep disordered breathing, particularly obstructive sleep apnea (OSA). A laboratory attended sleep study is typically considered gold standard for evaluation of sleep disordered breathing.   I had a long chat with the patient about my findings and the diagnosis of sleep apnea, particularly OSA, its prognosis and treatment options. We talked about medical/conservative treatments, surgical interventions and non-pharmacological approaches for symptom control. I explained, in particular, the risks and ramifications of untreated moderate to severe OSA, especially with respect to developing cardiovascular disease down the road, including congestive heart failure (CHF), difficult to treat hypertension, cardiac arrhythmias (particularly A-fib), neurovascular complications including TIA, stroke and dementia. Even type 2 diabetes has, in part, been linked to untreated OSA. Symptoms of untreated OSA may include (but may not be limited to) daytime sleepiness, nocturia (i.e. frequent nighttime urination), memory problems, mood irritability and  suboptimally controlled or worsening mood disorder such as depression and/or anxiety, lack of energy, lack of motivation, physical discomfort, as well as recurrent headaches, especially morning or nocturnal headaches. We talked about the importance of maintaining a healthy lifestyle and striving for healthy weight. In addition, we talked about the importance of striving for and maintaining good sleep hygiene. I recommended a  sleep study at this time. I outlined the differences between a laboratory attended sleep study which is considered more comprehensive and accurate over the option of a home sleep test (HST); the latter may lead to underestimation of sleep disordered breathing in some instances and does not help with diagnosing upper airway resistance syndrome and is not accurate enough to diagnose primary central sleep apnea typically. I outlined possible surgical and non-surgical treatment options of OSA, including the use of a positive airway pressure (PAP) device (i.e. CPAP, AutoPAP/APAP or BiPAP in certain circumstances), a custom-made dental device (aka oral appliance, which would require a referral to a specialist dentist or orthodontist typically, and is generally speaking not considered for patients with full dentures or edentulous state), upper airway surgical options, such as traditional UPPP (which is not considered a first-line treatment) or the Inspire device (hypoglossal nerve stimulator, which would involve a referral for consultation with an ENT surgeon, after careful selection, following inclusion criteria - also not first-line treatment). I explained the PAP treatment option to the patient in detail, as this is generally considered first-line treatment.  The patient indicated that he would be reluctant but willing to try PAP therapy, if the need arises.  We will pick up our discussion about the next steps and treatment options after testing.  We will keep him posted as to the test results by  phone call and/or MyChart messaging where possible.  We will plan to follow-up in sleep clinic accordingly as well.  I answered all his questions today and the patient was in agreement.   I encouraged him to call with any interim questions, concerns, problems or updates or email us  through MyChart.  Generally speaking, sleep test authorizations may take up to 2 weeks, sometimes less, sometimes longer, the patient is encouraged to get in touch with us  if they do not hear back from the sleep lab staff directly within the next 2 weeks.  Thank you very much for allowing me to participate in the care of this nice patient. If I can be of any further assistance to you please do not hesitate to call me at [phone].  Sincerely,   True Mar, MD, PhD

## 2024-01-26 ENCOUNTER — Encounter: Admitting: Family Medicine

## 2024-01-29 ENCOUNTER — Encounter: Admitting: Family Medicine

## 2024-01-31 ENCOUNTER — Encounter: Admitting: Family Medicine

## 2024-02-05 ENCOUNTER — Ambulatory Visit: Admitting: Family Medicine

## 2024-02-05 VITALS — BP 128/76 | HR 74 | Temp 98.8°F | Resp 18 | Ht 70.0 in | Wt 182.2 lb

## 2024-02-05 DIAGNOSIS — K219 Gastro-esophageal reflux disease without esophagitis: Secondary | ICD-10-CM

## 2024-02-05 DIAGNOSIS — Z125 Encounter for screening for malignant neoplasm of prostate: Secondary | ICD-10-CM | POA: Diagnosis not present

## 2024-02-05 DIAGNOSIS — Z0000 Encounter for general adult medical examination without abnormal findings: Secondary | ICD-10-CM | POA: Diagnosis not present

## 2024-02-05 DIAGNOSIS — Z1329 Encounter for screening for other suspected endocrine disorder: Secondary | ICD-10-CM

## 2024-02-05 DIAGNOSIS — Z131 Encounter for screening for diabetes mellitus: Secondary | ICD-10-CM

## 2024-02-05 DIAGNOSIS — Z13220 Encounter for screening for lipoid disorders: Secondary | ICD-10-CM | POA: Diagnosis not present

## 2024-02-05 NOTE — Patient Instructions (Addendum)
 Thank you for coming in today. No change in medications at this time. If there are any concerns on your bloodwork, I will let you know. Take care! Preventive Care 41-47 Years Old, Male Preventive care refers to lifestyle choices and visits with your health care provider that can promote health and wellness. Preventive care visits are also called wellness exams. What can I expect for my preventive care visit? Counseling During your preventive care visit, your health care provider may ask about your: Medical history, including: Past medical problems. Family medical history. Current health, including: Emotional well-being. Home life and relationship well-being. Sexual activity. Lifestyle, including: Alcohol, nicotine or tobacco, and drug use. Access to firearms. Diet, exercise, and sleep habits. Safety issues such as seatbelt and bike helmet use. Sunscreen use. Work and work Astronomer. Physical exam Your health care provider will check your: Height and weight. These may be used to calculate your BMI (body mass index). BMI is a measurement that tells if you are at a healthy weight. Waist circumference. This measures the distance around your waistline. This measurement also tells if you are at a healthy weight and may help predict your risk of certain diseases, such as type 2 diabetes and high blood pressure. Heart rate and blood pressure. Body temperature. Skin for abnormal spots. What immunizations do I need?  Vaccines are usually given at various ages, according to a schedule. Your health care provider will recommend vaccines for you based on your age, medical history, and lifestyle or other factors, such as travel or where you work. What tests do I need? Screening Your health care provider may recommend screening tests for certain conditions. This may include: Lipid and cholesterol levels. Diabetes screening. This is done by checking your blood sugar (glucose) after you have not  eaten for a while (fasting). Hepatitis B test. Hepatitis C test. HIV (human immunodeficiency virus) test. STI (sexually transmitted infection) testing, if you are at risk. Lung cancer screening. Prostate cancer screening. Colorectal cancer screening. Talk with your health care provider about your test results, treatment options, and if necessary, the need for more tests. Follow these instructions at home: Eating and drinking  Eat a diet that includes fresh fruits and vegetables, whole grains, lean protein, and low-fat dairy products. Take vitamin and mineral supplements as recommended by your health care provider. Do not drink alcohol if your health care provider tells you not to drink. If you drink alcohol: Limit how much you have to 0-2 drinks a day. Know how much alcohol is in your drink. In the [place] one drink equals one 12 oz bottle of beer (355 mL), one 5 oz glass of wine (148 mL), or one 1 oz glass of hard liquor (44 mL). Lifestyle Brush your teeth every morning and night with fluoride toothpaste. Floss one time each day. Exercise for at least 30 minutes 5 or more days each week. Do not use any products that contain nicotine or tobacco. These products include cigarettes, chewing tobacco, and vaping devices, such as e-cigarettes. If you need help quitting, ask your health care provider. Do not use drugs. If you are sexually active, practice safe sex. Use a condom or other form of protection to prevent STIs. Take aspirin  only as told by your health care provider. Make sure that you understand how much to take and what form to take. Work with your health care provider to find out whether it is safe and beneficial for you to take aspirin  daily. Find healthy ways to manage  stress, such as: Meditation, yoga, or listening to music. Journaling. Talking to a trusted person. Spending time with friends and family. Minimize exposure to UV radiation to reduce your risk of skin  cancer. Safety Always wear your seat belt while driving or riding in a vehicle. Do not drive: If you have been drinking alcohol. Do not ride with someone who has been drinking. When you are tired or distracted. While texting. If you have been using any mind-altering substances or drugs. Wear a helmet and other protective equipment during sports activities. If you have firearms in your house, make sure you follow all gun safety procedures. What's next? Go to your health care provider once a year for an annual wellness visit. Ask your health care provider how often you should have your eyes and teeth checked. Stay up to date on all vaccines. This information is not intended to replace advice given to you by your health care provider. Make sure you discuss any questions you have with your health care provider. Document Revised: 09/30/2020 Document Reviewed: 09/30/2020 Elsevier Patient Education  2024 [employer].

## 2024-02-05 NOTE — Progress Notes (Unsigned)
 Subjective:  Patient ID: Stephen Conner, male    DOB: 08/26/1975  Age: 48 y.o. MRN: [id number]  CC:  Chief Complaint  Patient presents with   Annual Exam    No questions or concerns.     HPI Stephen Conner presents for Annual Exam   PCP: me Sleep specialist - Dr. Buck,  office visit October 6, plan for home sleep test, snoring, daytime somnolence.  ESS 5 out of 24, fatigue severity score 25 out of 63.  Has not yet completed HST, MyChart message noted regarding question of cost for overnight sleep study.   GERD See note in April.  Question of dark urine with use of omeprazole .  Negative H. pylori testing.  Switch to Pepcid 20 mg daily, mild breakthrough symptoms just prior to dosage due.  Had cut back on coffee.  Option of twice daily dosing for H2 blocker discussed with option of repeat attempt at PPI if ineffective. Pepcid once per day - as needed - has taken twice per day with resolution of sx's manageable on daily dosing, uncontrolled off meds.      02/05/2024    3:15 PM 07/28/2023    9:42 AM 12/21/2022   11:42 AM 12/28/2020    2:10 PM 06/24/2020    9:22 AM  Depression screen PHQ 2/9  Decreased Interest 0 0 0 0 0  Down, Depressed, Hopeless 0 0 0 0 0  PHQ - 2 Score 0 0 0 0 0  Altered sleeping 3 2  0   Tired, decreased energy 1 0  0   Change in appetite 0 0  0   Feeling bad or failure about yourself  0 0  0   Trouble concentrating 0 0  0   Moving slowly or fidgety/restless 0 0  0   Suicidal thoughts 0 0  0   PHQ-9 Score 4 2  0   Difficult doing work/chores Not difficult at all   Not difficult at all     Health Maintenance  Topic Date Due   Hepatitis C Screening  Never done   Hepatitis B Vaccines 19-59 Average Risk (1 of 3 - 19+ 3-dose series) Never done   COVID-19 Vaccine (2 - 2025-26 season) 02/21/2024 (Originally 12/18/2023)   Influenza Vaccine  07/16/2024 (Originally 11/17/2023)   DTaP/Tdap/Td (2 - Td or Tdap) 11/29/2026   Colonoscopy  12/04/2029   HIV Screening   Completed   Pneumococcal Vaccine  Aged Out   HPV VACCINES  Aged Out   Meningococcal B Vaccine  Aged Out  Colonoscopy 12/27/2019, repeat 10 years Prostate: does not  have family history of prostate cancer The natural history of prostate cancer and ongoing controversy regarding screening and potential treatment outcomes of prostate cancer has been discussed with the patient. The meaning of a false positive PSA and a false negative PSA has been discussed. He indicates understanding of the limitations of this screening test and wishes  to proceed with screening PSA testing. Possible slight decreased stream last 6 mo. Takes fish oil supplement. Eating more mixed nuts and dark chocolate.  No results found for: PSA1, PSA   Immunization History  Administered Date(s) Administered   Janssen (J&J) SARS-COV-2 Vaccination 12/26/2019   Tdap 11/28/2016  Flu vaccine - declines Declines covid booster.   No results found. Once per year, contact lenses. No recent acute visual change  Dental: every 6 months.  Alcohol:  1-2 per month.   Tobacco: none  Exercise: exercise goal of 3 days  per week - currently 1-2 days per week, has personal trainer since early August, swimming. Walking dog on other days, yardwork.  Son is getting married in November.    History Patient Active Problem List   Diagnosis Date Noted   History of rhabdomyolysis 06/16/2023   GERD (gastroesophageal reflux disease) 06/16/2023   Sensorineural hearing loss (SNHL), bilateral 03/22/2021   Tinnitus of both ears 03/22/2021   Rhabdomyolysis 11/01/2019   Hypokalemia 11/01/2019   Testosterone deficiency 11/01/2019   Allergic urticaria 05/24/2016   Seasonal and perennial allergic rhinitis 05/24/2016   Seasonal and perennial allergic rhinitis 05/24/2016   Past Medical History:  Diagnosis Date   Allergy    Asthma    As a child   GERD (gastroesophageal reflux disease)    Migraine    Rhabdomyolysis    Urticaria    Past  Surgical History:  Procedure Laterality Date   SHOULDER SURGERY     Right x 2    Allergies  Allergen Reactions   Hydrocodone Itching   Imitrex [Sumatriptan] Anaphylaxis   Tramadol Other (See Comments)    twitchy   Zonisamide Rash   Amitriptyline Other (See Comments)    Per pt - no mood   Gabapentin  Tinitus   Prior to Admission medications   Medication Sig Start Date End Date Taking? Authorizing Provider  Cholecalciferol (VITAMIN D3) 125 MCG (5000 UT) TABS Take 1 tablet by mouth daily.   Yes [provider]  CREATINE PO Take by mouth.   Yes [provider]  famotidine (PEPCID) 20 MG tablet Take 20 mg by mouth daily.   Yes [provider]  MAGNESIUM PO Take 1-3 tablets by mouth in the morning and at bedtime. Take 1 tablet in the morning and Take 3 tablets at bedtime   Yes [provider]  Menaquinone-7 (VITAMIN K2 PO) Take 150 mcg by mouth daily.   Yes [provider]  Nutritional Supplements (DHEA PO) Take 15 mg by mouth at bedtime.   Yes [provider]  Pregnenolone Micronized (PREGNENOLONE PO) Take 150 mg by mouth daily.   Yes [provider]  vitamin B-12 (CYANOCOBALAMIN) 1000 MCG tablet Take 1,000 mcg by mouth daily.   Yes [provider]  meloxicam (MOBIC) 7.5 MG tablet Take 7.5 mg by mouth daily. Patient not taking: Reported on 02/05/2024    [provider]  omeprazole  (PRILOSEC) 40 MG capsule Take 1 capsule (40 mg total) by mouth daily. Patient not taking: Reported on 02/05/2024 06/16/23   Mahlon Comer BRAVO, MD   Social History   Socioeconomic History   Marital status: Married    Spouse name: Not on file   Number of children: Not on file   Years of education: Not on file   Highest education level: Bachelor's degree (e.g., BA, AB, BS)  Occupational History   Not on file  Tobacco Use   Smoking status: Never   Smokeless tobacco: Never  Vaping Use   Vaping status: Never Used  Substance  and Sexual Activity   Alcohol use: Yes    Alcohol/week: 1.0 standard drink of alcohol    Types: 1 Cans of beer per week    Comment: occasional   Drug use: No   Sexual activity: Not on file  Other Topics Concern   Not on file  Social History Narrative   Right Hand      Coffee 2 cups daily and tea       Psychiatric nurse.    Social  Drivers of Corporate investment banker Strain: Low Risk  (02/05/2024)   Overall Financial Resource Strain (CARDIA)    Difficulty of Paying Living Expenses: Not hard at all  Food Insecurity: No Food Insecurity (02/05/2024)   Hunger Vital Sign    Worried About Running Out of Food in the Last Year: Never true    Ran Out of Food in the Last Year: Never true  Transportation Needs: No Transportation Needs (02/05/2024)   PRAPARE - Administrator, Civil Service (Medical): No    Lack of Transportation (Non-Medical): No  Physical Activity: Sufficiently Active (02/05/2024)   Exercise Vital Sign    Days of Exercise per Week: 3 days    Minutes of Exercise per Session: 60 min  Stress: No Stress Concern Present (02/05/2024)   [employer] of Occupational Health - Occupational Stress Questionnaire    Feeling of Stress: Only a little  Social Connections: Socially Integrated (02/05/2024)   Social Connection and Isolation Panel    Frequency of Communication with Friends and Family: Three times a week    Frequency of Social Gatherings with Friends and Family: Twice a week    Attends Religious Services: More than 4 times per year    Active Member of [employer] or Organizations: Yes    Attends Engineer, structural: More than 4 times per year    Marital Status: Married  Catering manager Violence: Not on file    Review of Systems 13 point review of systems per patient health survey noted.  Negative other than as indicated above or in HPI.    Objective:   Vitals:   02/05/24 1511  BP: 128/76  Pulse: 74  Resp: 18  Temp: 98.8 F (37.1 C)   TempSrc: Temporal  SpO2: 98%  Weight: 182 lb 3.2 oz (82.6 kg)  Height: 5' 10 (1.778 m)   {Vitals History (Optional):23777}  Physical Exam Vitals reviewed.  Constitutional:      Appearance: He is well-developed.  HENT:     Head: Normocephalic and atraumatic.     Right Ear: External ear normal.     Left Ear: External ear normal.  Eyes:     Conjunctiva/sclera: Conjunctivae normal.     Pupils: Pupils are equal, round, and reactive to light.  Neck:     Thyroid: No thyromegaly.  Cardiovascular:     Rate and Rhythm: Normal rate and regular rhythm.     Heart sounds: Normal heart sounds.  Pulmonary:     Effort: Pulmonary effort is normal. No respiratory distress.     Breath sounds: Normal breath sounds. No wheezing.  Abdominal:     General: There is no distension.     Palpations: Abdomen is soft.     Tenderness: There is no abdominal tenderness.  Musculoskeletal:        General: No tenderness. Normal range of motion.     Cervical back: Normal range of motion and neck supple.  Lymphadenopathy:     Cervical: No cervical adenopathy.  Skin:    General: Skin is warm and dry.  Neurological:     Mental Status: He is alert and oriented to person, place, and time.     Deep Tendon Reflexes: Reflexes are normal and symmetric.  Psychiatric:        Behavior: Behavior normal.        Assessment & Plan:  Stephen Conner is a 48 y.o. male . No diagnosis found.   No orders of the defined types were  placed in this encounter.  There are no Patient Instructions on file for this visit.    Signed,   Reyes Pines, MD [facility], [facility] 02/05/24 3:50 PM

## 2024-02-06 LAB — COMPREHENSIVE METABOLIC PANEL WITH GFR
ALT: 31 U/L (ref 0–53)
AST: 26 U/L (ref 0–37)
Albumin: 4.8 g/dL (ref 3.5–5.2)
Alkaline Phosphatase: 68 U/L (ref 39–117)
BUN: 22 mg/dL (ref 6–23)
CO2: 29 meq/L (ref 19–32)
Calcium: 9.6 mg/dL (ref 8.4–10.5)
Chloride: 102 meq/L (ref 96–112)
Creatinine, Ser: 1.22 mg/dL (ref 0.40–1.50)
GFR: 70.87 mL/min (ref >60.00)
Glucose, Bld: 86 mg/dL (ref 70–99)
Potassium: 3.9 meq/L (ref 3.5–5.1)
Sodium: 139 meq/L (ref 135–145)
Total Bilirubin: 0.5 mg/dL (ref 0.2–1.2)
Total Protein: 7.6 g/dL (ref 6.0–8.3)

## 2024-02-06 LAB — PSA: PSA: 1.38 ng/mL (ref 0.10–4.00)

## 2024-02-06 LAB — LIPID PANEL
Cholesterol: 193 mg/dL (ref 0–200)
HDL: 48.9 mg/dL (ref >39.00)
LDL Cholesterol: 112 mg/dL — ABNORMAL HIGH (ref 0–99)
NonHDL: 144.24
Total CHOL/HDL Ratio: 4
Triglycerides: 162 mg/dL — ABNORMAL HIGH (ref 0.0–149.0)
VLDL: 32.4 mg/dL (ref 0.0–40.0)

## 2024-02-06 LAB — TSH: TSH: 0.68 u[IU]/mL (ref 0.35–5.50)

## 2024-02-06 LAB — HEMOGLOBIN A1C: Hgb A1c MFr Bld: 5.3 % (ref 4.6–6.5)

## 2024-02-13 ENCOUNTER — Ambulatory Visit: Payer: Self-pay | Admitting: Family Medicine

## 2024-02-26 ENCOUNTER — Ambulatory Visit

## 2024-02-26 DIAGNOSIS — G4733 Obstructive sleep apnea (adult) (pediatric): Secondary | ICD-10-CM

## 2024-02-26 DIAGNOSIS — G4719 Other hypersomnia: Secondary | ICD-10-CM

## 2024-02-26 DIAGNOSIS — Z820 Family history of epilepsy and other diseases of the nervous system: Secondary | ICD-10-CM

## 2024-02-26 DIAGNOSIS — R0683 Snoring: Secondary | ICD-10-CM

## 2024-02-26 DIAGNOSIS — Z9189 Other specified personal risk factors, not elsewhere classified: Secondary | ICD-10-CM

## 2024-02-26 DIAGNOSIS — R0681 Apnea, not elsewhere classified: Secondary | ICD-10-CM

## 2024-02-26 DIAGNOSIS — R519 Headache, unspecified: Secondary | ICD-10-CM

## 2024-02-26 DIAGNOSIS — R351 Nocturia: Secondary | ICD-10-CM

## 2024-02-29 NOTE — Progress Notes (Unsigned)
 SABRA

## 2024-03-01 ENCOUNTER — Ambulatory Visit: Payer: Self-pay | Admitting: Neurology

## 2024-03-01 DIAGNOSIS — G4733 Obstructive sleep apnea (adult) (pediatric): Secondary | ICD-10-CM

## 2024-03-01 NOTE — Procedures (Signed)
 [facility]  HOME SLEEP TEST (SANSA) REPORT (Mail-Out Device):   STUDY DATE: 02/27/2024  DOB: 27-Dec-1975  MRN: [id number]  ORDERING CLINICIAN: True Mar, MD, PhD   REFERRING CLINICIAN: Levora Reyes SAUNDERS, MD   CLINICAL INFORMATION/HISTORY (obtained from visit note dated 01/22/2024): 48 year old male with an underlying medical history of reflux disease, asthma, history of rhabdomyolysis, urticaria, migraine headaches and mildly overweight state, who reports snoring and excessive daytime somnolence as well as witnessed apneas.  PATIENT'S LAST REPORTED EPWORTH SLEEPINESS SCORE (ESS): 5/24.  BMI (at the time of sleep clinic visit and/or test date): 26.4 kg/m  FINDINGS:   Study Protocol:    The SANSA single-point-of-skin-contact chest-worn sensor - an FDA cleared and DOT approved type 4 home sleep test device - measures eight physiological channels,  including blood oxygen saturation (measured via PPG [photoplethysmography]), EKG-derived heart rate, respiratory effort, chest movement (measured via accelerometer), snoring, body position, and actigraphy. The device is designed to be worn for up to 10 hours per study.   Sleep Summary:   Total Recording Time (hours, min): 8 hours, 5 min  Total Effective Sleep Time (hours, min):  7 hours, 16 min  Sleep Efficiency (%):    90%   Respiratory Indices:   Calculated sAHI (per hour):  17/hour         Oxygen Saturation Statistics:    Oxygen Saturation (%) Mean: 95.8%   Minimum oxygen saturation (%):                 79.8%   O2 Saturation Range (%): 79.8-99.6%   Time below or at 88% saturation: <1 min   Pulse Rate Statistics:   Pulse Mean (bpm):    64/min    Pulse Range (51-114/min)   Snoring: Mild to loud  IMPRESSION/DIAGNOSES:   OSA (obstructive sleep apnea), moderate   RECOMMENDATIONS:   This home sleep test demonstrates moderate obstructive sleep apnea with a total AHI of 17/hour and O2 nadir of  79.8%.  Variable snoring was detected, ranging from mild to louder. Treatment with a positive airway pressure (PAP) device is recommended. The patient will be advised to proceed with an autoPAP titration/trial at home for now. A full night titration study may be considered to optimize treatment settings, monitor proper oxygen saturations and aid with improvement of tolerance and adherence, if needed down the road. Alternative treatment options may include a dental device through dentistry or orthodontics in selected patients or Inspire (hypoglossal nerve stimulator) in carefully selected patients (meeting inclusion criteria).  Concomitant weight loss is recommended (where clinically appropriate). Please note that untreated obstructive sleep apnea may carry additional perioperative morbidity. Patients with significant obstructive sleep apnea should receive perioperative PAP therapy and the surgeons and particularly the anesthesiologist should be informed of the diagnosis and the severity of the sleep disordered breathing. The patient should be cautioned not to drive, work at heights, or operate dangerous or heavy equipment when tired or sleepy. Review and reiteration of good sleep hygiene measures should be pursued with any patient. Other causes of the patient's symptoms, including circadian rhythm disturbances, an underlying mood disorder, medication effect and/or an underlying medical problem cannot be ruled out based on this test. Clinical correlation is recommended.  The patient and his referring provider will be notified of the test results. The patient will be seen in follow up in sleep clinic at [facility].  I certify that I have reviewed the raw data recording prior to the issuance of this report in accordance  with the standards of the American Academy of Sleep Medicine (AASM).    INTERPRETING PHYSICIAN:   True Mar, MD, PhD Medical Director, [facility]) Diplomat, ABPN (Neurology and Sleep)   [facility] [address], [address] [place], [place] [address] [phone]

## 2024-03-04 ENCOUNTER — Encounter: Payer: Self-pay | Admitting: Neurology

## 2024-04-02 ENCOUNTER — Ambulatory Visit
Admission: EM | Admit: 2024-04-02 | Discharge: 2024-04-02 | Disposition: A | Discharge status: Home / Self Care | Attending: Family Medicine | Admitting: Family Medicine

## 2024-04-02 DIAGNOSIS — J40 Bronchitis, not specified as acute or chronic: Secondary | ICD-10-CM

## 2024-04-02 DIAGNOSIS — J329 Chronic sinusitis, unspecified: Secondary | ICD-10-CM

## 2024-04-02 MED ORDER — AMOXICILLIN-POT CLAVULANATE 875-125 MG PO TABS: 1.0000 | ORAL_TABLET | Freq: Two times a day (BID) | ORAL | 0 refills | Status: AC

## 2024-04-02 MED ORDER — PREDNISONE 20 MG PO TABS: ORAL_TABLET | ORAL | 0 refills | Status: AC

## 2024-04-02 MED ORDER — PROMETHAZINE-DM 6.25-15 MG/5ML PO SYRP: 5.0000 mL | ORAL_SOLUTION | Freq: Three times a day (TID) | ORAL | 0 refills | Status: AC | PRN

## 2024-04-02 NOTE — ED Triage Notes (Signed)
 Pt c/o prod cough, fatigue, sore throat, nasal drainage sx started 1 week ago-felt [facility] yesterday and had blood on phlegm and green nasal drainage-no sx meds today-NAD-steady gait

## 2024-04-02 NOTE — ED Provider Notes (Signed)
 [facility]  Note:  This document was prepared using Conservation officer, historic buildings and may include unintentional dictation errors.  MRN: [id number] DOB: 1975-10-30  Subjective:   Stephen Conner is a 48 y.o. male presenting for 1 week history of persistent and worsening sinus drainage, throat pain, malaise and fatigue, productive cough. Has started to see bloody mucus discharge from the sinuses.  Has a history of childhood asthma. Has allergic rhinitis. No smoking of any kind including cigarettes, cigars, vaping, marijuana use.    Current Outpatient Medications  Medication Instructions   Cholecalciferol (VITAMIN D3) 125 MCG (5000 UT) TABS 1 tablet, Daily   CREATINE PO Take by mouth.   cyanocobalamin (VITAMIN B12) 1,000 mcg, Daily   famotidine (PEPCID) 20 mg, Daily   MAGNESIUM PO 1-3 tablets, 2 times daily   meloxicam (MOBIC) 7.5 mg, Daily   Menaquinone-7 (VITAMIN K2 PO) 150 mcg, Daily   Nutritional Supplements (DHEA PO) 15 mg, Daily at bedtime   omeprazole  (PRILOSEC) 40 mg, Oral, Daily   Pregnenolone Micronized (PREGNENOLONE PO) 150 mg, Daily    Allergies[1]  Past Medical History:  Diagnosis Date   Allergy    Asthma    As a child   GERD (gastroesophageal reflux disease)    Migraine    Rhabdomyolysis    Sleep apnea    per pt   Urticaria      Past Surgical History:  Procedure Laterality Date   SHOULDER SURGERY     Right x 2     Family History  Problem Relation Age of Onset   Fibromyalgia Mother    Hypertension Mother    Obesity Mother    Sleep apnea Father    Other Father        shoulder surgeries   Polycystic ovary syndrome Sister    Multiple sclerosis Brother    Healthy Son    Healthy Son    Other Maternal Grandmother        shingles    Social History   Occupational History   Not on file  Tobacco Use   Smoking status: Never   Smokeless tobacco: Never  Vaping Use   Vaping status: Never Used  Substance and Sexual  Activity   Alcohol use: Yes    Comment: occasional   Drug use: No   Sexual activity: Not on file     ROS   Objective:   Vitals: BP (!) 151/83 (BP Location: Left Arm)   Pulse 78   Temp (!) 97.4 F (36.3 C) (Oral)   Resp 16   SpO2 97%   Physical Exam Constitutional:      General: He is not in acute distress.    Appearance: Normal appearance. He is well-developed and normal weight. He is not ill-appearing, toxic-appearing or diaphoretic.  HENT:     Head: Normocephalic and atraumatic.     Right Ear: Tympanic membrane, ear canal and external ear normal. No drainage, swelling or tenderness. No middle ear effusion. There is no impacted cerumen. Tympanic membrane is not erythematous or bulging.     Left Ear: Tympanic membrane, ear canal and external ear normal. No drainage, swelling or tenderness.  No middle ear effusion. There is no impacted cerumen. Tympanic membrane is not erythematous or bulging.     Nose: Congestion present. No rhinorrhea.     Mouth/Throat:     Mouth: Mucous membranes are moist.     Pharynx: No pharyngeal swelling, oropharyngeal exudate, posterior oropharyngeal erythema or uvula  swelling.     Tonsils: No tonsillar exudate or tonsillar abscesses. 0 on the right. 0 on the left.  Eyes:     General: No scleral icterus.       Right eye: No discharge.        Left eye: No discharge.     Extraocular Movements: Extraocular movements intact.     Conjunctiva/sclera: Conjunctivae normal.  Cardiovascular:     Rate and Rhythm: Normal rate and regular rhythm.     Heart sounds: Normal heart sounds. No murmur heard.    No friction rub. No gallop.  Pulmonary:     Effort: Pulmonary effort is normal. No respiratory distress.     Breath sounds: No stridor. Rhonchi (trace over mid lung fields bilaterally) present. No wheezing or rales.  Musculoskeletal:     Cervical back: Normal range of motion and neck supple. No rigidity. No muscular tenderness.  Neurological:     General:  No focal deficit present.     Mental Status: He is alert and oriented to person, place, and time.  Psychiatric:        Mood and Affect: Mood normal.        Behavior: Behavior normal.        Thought Content: Thought content normal.        Judgment: Judgment normal.     Assessment and Plan :   PDMP not reviewed this encounter.  1. Sinobronchitis      Start Augmentin  and prednisone  for sinobronchitis. Use supportive care otherwise. Will defer imaging for now. Counseled patient on potential for adverse effects with medications prescribed/recommended today, ER and return-to-clinic precautions discussed, patient verbalized understanding.     [1]  Allergies Allergen Reactions   Hydrocodone Itching   Imitrex [Sumatriptan] Anaphylaxis   Tramadol Other (See Comments)    twitchy   Zonisamide Rash   Amitriptyline Other (See Comments)    Per pt - no mood   Gabapentin  Tinitus     Christopher Savannah, [place] 04/02/24 [id number]

## 2024-04-02 NOTE — Discharge Instructions (Addendum)
 Start amoxicillin -clavulanate and prednisone  to help with your sinobronchitis infection. Use cough syrup as needed.

## 2025-02-07 ENCOUNTER — Encounter: Admitting: Family Medicine
